# Patient Record
Sex: Male | Born: 1985 | Race: Black or African American | Hispanic: No | Marital: Single
Health system: Southern US, Community
[De-identification: ages and names within clinical notes are randomized; demographics above are authoritative.]

## PROBLEM LIST (undated history)

## (undated) DIAGNOSIS — L0390 Cellulitis, unspecified: Secondary | ICD-10-CM

---

## 2017-10-07 ENCOUNTER — Encounter (HOSPITAL_COMMUNITY): Payer: Self-pay | Admitting: Emergency Medicine

## 2017-10-07 ENCOUNTER — Ambulatory Visit (HOSPITAL_COMMUNITY)
Admission: EM | Admit: 2017-10-07 | Discharge: 2017-10-07 | Disposition: A | Discharge status: Home / Self Care | Payer: Self-pay | Attending: Physician Assistant | Admitting: Physician Assistant

## 2017-10-07 DIAGNOSIS — S0181XA Laceration without foreign body of other part of head, initial encounter: Secondary | ICD-10-CM

## 2017-10-07 MED ORDER — TETANUS-DIPHTH-ACELL PERTUSSIS 5-2.5-18.5 LF-MCG/0.5 IM SUSP: 0.5000 mL | Freq: Once | INTRAMUSCULAR | Status: DC

## 2017-10-07 NOTE — ED Provider Notes (Signed)
10/07/2017 5:45 PM   DOB: [birth year] / MRN: [id number]  SUBJECTIVE:  Douglas Mata is a 32 y.o. male presenting for laceration to the right lateral supraorbital arch.  Patient tells me he was standing up today and hit his head on the cabinet.  He came straight here after.  Had a tetanus shot roughly a year ago.  Denies headache, vision changes, eye pain.    He has No Known Allergies.   He  has no past medical history on file.    He   He  has no sexual activity history on file. The patient  has no past surgical history on file.  His family history is not on file.  ROS per HPI  OBJECTIVE:  BP 116/80 (BP Location: Left Arm)   Pulse 68   Temp 97.8 F (36.6 C) (Oral)   Resp 18   SpO2 100%   Physical Exam  Constitutional: He is oriented to person, place, and time. He appears well-developed. He does not appear ill.  HENT:  Head:    Eyes: Pupils are equal, round, and reactive to light. Conjunctivae and EOM are normal.  Cardiovascular: Normal rate, S1 normal, S2 normal and normal pulses.  Pulmonary/Chest: Effort normal. He has no rales.  Abdominal: He exhibits no distension.  Musculoskeletal: Normal range of motion. He exhibits no edema.  Neurological: He is alert and oriented to person, place, and time. No cranial nerve deficit. Coordination normal.  Skin: Skin is warm and dry. He is not diaphoretic.  Psychiatric: He has a normal mood and affect.  Nursing note and vitals reviewed.  Risk and benefits discussed and verbal consent obtained. Anesthetic allergies reviewed. Patient anesthetized using 1:1 mix of 2% lidocaine with epi and Marcaine. The wound was cleansed thoroughly with soap and water. Sterile prep and drape. Wound closed with 3 throws using 6-0 dissolvalble suture material. Hemostasis achieved. Mupirocin applied to the wound and bandage placed. The patient tolerated well. Wound instructions were provided and the patient is to return in 8 days for suture removal, if the  sutures do not fall out first.    No results found for this or any previous visit (from the past 72 hour(s)).  No results found.  ASSESSMENT AND PLAN:  No orders of the defined types were placed in this encounter.    Laceration of forehead, initial encounter: Repaired.       The patient is advised to call or return to clinic if he does not see an improvement in symptoms, or to seek the care of the closest emergency department if he worsens with the above plan.   Deliah Boston, MHS, PA-C 10/07/2017 5:45 PM    Ofilia Neas, PA-C 10/07/17 1747

## 2017-10-07 NOTE — Discharge Instructions (Addendum)
Come back in in about about 8 days if the suture have not fallen out.  After 6 days it is okay to start to gently tug on the sutures yourself if you would like.

## 2017-10-07 NOTE — ED Triage Notes (Signed)
Pt with laceration to right side of head from cabinet

## 2019-06-02 ENCOUNTER — Emergency Department (HOSPITAL_COMMUNITY)
Admission: EM | Admit: 2019-06-02 | Discharge: 2019-06-02 | Disposition: A | Discharge status: Home / Self Care | Payer: Self-pay | Attending: Emergency Medicine | Admitting: Emergency Medicine

## 2019-06-02 ENCOUNTER — Other Ambulatory Visit: Payer: Self-pay

## 2019-06-02 ENCOUNTER — Encounter (HOSPITAL_COMMUNITY): Payer: Self-pay | Admitting: Emergency Medicine

## 2019-06-02 DIAGNOSIS — S0181XA Laceration without foreign body of other part of head, initial encounter: Secondary | ICD-10-CM | POA: Insufficient documentation

## 2019-06-02 DIAGNOSIS — Y999 Unspecified external cause status: Secondary | ICD-10-CM | POA: Insufficient documentation

## 2019-06-02 DIAGNOSIS — Z5321 Procedure and treatment not carried out due to patient leaving prior to being seen by health care provider: Secondary | ICD-10-CM | POA: Insufficient documentation

## 2019-06-02 DIAGNOSIS — Y9289 Other specified places as the place of occurrence of the external cause: Secondary | ICD-10-CM | POA: Insufficient documentation

## 2019-06-02 DIAGNOSIS — Y9389 Activity, other specified: Secondary | ICD-10-CM | POA: Insufficient documentation
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
## 2020-02-28 ENCOUNTER — Other Ambulatory Visit: Payer: Self-pay

## 2020-02-28 ENCOUNTER — Emergency Department (HOSPITAL_COMMUNITY)
Admission: EM | Admit: 2020-02-28 | Discharge: 2020-02-29 | Disposition: A | Discharge status: Home / Self Care | Payer: Self-pay | Attending: Emergency Medicine | Admitting: Emergency Medicine

## 2020-02-28 ENCOUNTER — Emergency Department (HOSPITAL_COMMUNITY): Payer: Self-pay

## 2020-02-28 DIAGNOSIS — Z23 Encounter for immunization: Secondary | ICD-10-CM | POA: Insufficient documentation

## 2020-02-28 DIAGNOSIS — S0993XA Unspecified injury of face, initial encounter: Secondary | ICD-10-CM

## 2020-02-28 DIAGNOSIS — S80211A Abrasion, right knee, initial encounter: Secondary | ICD-10-CM | POA: Insufficient documentation

## 2020-02-28 DIAGNOSIS — S51811A Laceration without foreign body of right forearm, initial encounter: Secondary | ICD-10-CM | POA: Insufficient documentation

## 2020-02-28 DIAGNOSIS — R451 Restlessness and agitation: Secondary | ICD-10-CM | POA: Insufficient documentation

## 2020-02-28 DIAGNOSIS — K08109 Complete loss of teeth, unspecified cause, unspecified class: Secondary | ICD-10-CM | POA: Insufficient documentation

## 2020-02-28 DIAGNOSIS — Y9289 Other specified places as the place of occurrence of the external cause: Secondary | ICD-10-CM | POA: Insufficient documentation

## 2020-02-28 DIAGNOSIS — W228XXA Striking against or struck by other objects, initial encounter: Secondary | ICD-10-CM | POA: Insufficient documentation

## 2020-02-28 DIAGNOSIS — Z20822 Contact with and (suspected) exposure to covid-19: Secondary | ICD-10-CM | POA: Insufficient documentation

## 2020-02-28 DIAGNOSIS — Y9389 Activity, other specified: Secondary | ICD-10-CM | POA: Insufficient documentation

## 2020-02-28 DIAGNOSIS — F10920 Alcohol use, unspecified with intoxication, uncomplicated: Secondary | ICD-10-CM

## 2020-02-28 LAB — RESPIRATORY PANEL BY RT PCR (FLU A&B, COVID)
Influenza A by PCR: NEGATIVE
Influenza B by PCR: NEGATIVE
SARS Coronavirus 2 by RT PCR: NEGATIVE

## 2020-02-28 LAB — CBC WITH DIFFERENTIAL/PLATELET
Abs Immature Granulocytes: 0.03 K/uL (ref 0.00–0.07)
Basophils Absolute: 0 K/uL (ref 0.0–0.1)
Basophils Relative: 0 %
Eosinophils Absolute: 0 K/uL (ref 0.0–0.5)
Eosinophils Relative: 1 %
HCT: 35.2 % — ABNORMAL LOW (ref 39.0–52.0)
Hemoglobin: 12.5 g/dL — ABNORMAL LOW (ref 13.0–17.0)
Immature Granulocytes: 0 %
Lymphocytes Relative: 32 %
Lymphs Abs: 2.6 K/uL (ref 0.7–4.0)
MCH: 32.3 pg (ref 26.0–34.0)
MCHC: 35.5 g/dL (ref 30.0–36.0)
MCV: 91 fL (ref 80.0–100.0)
Monocytes Absolute: 0.8 K/uL (ref 0.1–1.0)
Monocytes Relative: 9 %
Neutro Abs: 4.7 K/uL (ref 1.7–7.7)
Neutrophils Relative %: 58 %
Platelets: 218 K/uL (ref 150–400)
RBC: 3.87 MIL/uL — ABNORMAL LOW (ref 4.22–5.81)
RDW: 13.1 % (ref 11.5–15.5)
WBC: 8.1 K/uL (ref 4.0–10.5)
nRBC: 0 % (ref 0.0–0.2)

## 2020-02-28 LAB — BASIC METABOLIC PANEL
Anion gap: 14 (ref 5–15)
BUN: 13 mg/dL (ref 6–20)
CO2: 23 mmol/L (ref 22–32)
Calcium: 9.1 mg/dL (ref 8.9–10.3)
Chloride: 104 mmol/L (ref 98–111)
Creatinine, Ser: 1.33 mg/dL — ABNORMAL HIGH (ref 0.61–1.24)
GFR calc Af Amer: >60 mL/min (ref >60)
GFR calc non Af Amer: >60 mL/min (ref >60)
Glucose, Bld: 83 mg/dL (ref 70–99)
Potassium: 3.4 mmol/L — ABNORMAL LOW (ref 3.5–5.1)
Sodium: 141 mmol/L (ref 135–145)

## 2020-02-28 LAB — ETHANOL: Alcohol, Ethyl (B): 254 mg/dL — ABNORMAL HIGH (ref <10)

## 2020-02-28 MED ORDER — SODIUM CHLORIDE 0.9 % IV BOLUS
1000.0000 mL | Freq: Once | INTRAVENOUS | Status: AC
  Administered 2020-02-28: 1000 mL via INTRAVENOUS

## 2020-02-28 MED ORDER — SODIUM CHLORIDE 0.9 % IV BOLUS
1000.0000 mL | Freq: Once | INTRAVENOUS | Status: AC
Start: 2020-02-28 — End: 2020-02-29
  Administered 2020-02-28: 1000 mL via INTRAVENOUS

## 2020-02-28 MED ORDER — CEFAZOLIN SODIUM-DEXTROSE 1-4 GM/50ML-% IV SOLN
1.0000 g | Freq: Once | INTRAVENOUS | Status: AC
Start: 2020-02-28 — End: 2020-02-28
  Administered 2020-02-28: 1 g via INTRAVENOUS
  Filled 2020-02-28: qty 50

## 2020-02-28 MED ORDER — FENTANYL CITRATE (PF) 100 MCG/2ML IJ SOLN
50.0000 ug | Freq: Once | INTRAMUSCULAR | Status: AC
Start: 2020-02-28 — End: 2020-02-28
  Administered 2020-02-28: 50 ug via INTRAVENOUS
  Filled 2020-02-28: qty 2

## 2020-02-28 MED ORDER — TETANUS-DIPHTH-ACELL PERTUSSIS 5-2.5-18.5 LF-MCG/0.5 IM SUSP
0.5000 mL | Freq: Once | INTRAMUSCULAR | Status: AC
  Administered 2020-02-28: 0.5 mL via INTRAMUSCULAR
  Filled 2020-02-28: qty 0.5

## 2020-02-28 MED ORDER — LIDOCAINE-EPINEPHRINE 2 %-1:200000 IJ SOLN
20.0000 mL | Freq: Once | INTRAMUSCULAR | Status: AC
Start: 2020-02-28 — End: 2020-02-28
  Administered 2020-02-28: 20 mL
  Filled 2020-02-28: qty 20
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
## 2020-02-29 ENCOUNTER — Other Ambulatory Visit: Payer: Self-pay

## 2020-02-29 LAB — TYPE AND SCREEN
ABO/RH(D): A POS
Antibody Screen: NEGATIVE

## 2020-02-29 LAB — RAPID URINE DRUG SCREEN, HOSP PERFORMED
Amphetamines: POSITIVE — AB
Barbiturates: NOT DETECTED
Benzodiazepines: NOT DETECTED
Cocaine: POSITIVE — AB
Opiates: NOT DETECTED
Tetrahydrocannabinol: POSITIVE — AB

## 2020-02-29 MED ORDER — CEPHALEXIN 500 MG PO CAPS: 500.0000 mg | ORAL_CAPSULE | Freq: Three times a day (TID) | ORAL | 0 refills | Status: AC

## 2020-02-29 NOTE — ED Notes (Signed)
Patient discharged A&Ox4. Right arm/hand is warm to touch and peripheral pulse are +3 in both wrist. Patient is ambulatory and has no concerns at this time.

## 2020-02-29 NOTE — Discharge Instructions (Addendum)
Follow-up with the dentist listed below for evaluation of your missing tooth.  Wash the lacerations twice a day with soapy water.  Otherwise, keep covered and clean. Take Tylenol or ibuprofen as needed for pain. Take antibiotics as prescribed to help prevent infection. If you have bleeding, hold firm, constant pressure for at least 30 minutes.  Bleeding continues, return to the ER.  You will need to return to the ER in 10 days for suture removal.  Return to the emergency room if you develop fevers, pus draining from the cuts, severe worsening pain, any new, worsening, concerning symptoms.

## 2020-03-02 ENCOUNTER — Encounter (HOSPITAL_COMMUNITY): Payer: Self-pay

## 2020-03-02 ENCOUNTER — Other Ambulatory Visit: Payer: Self-pay

## 2020-03-02 ENCOUNTER — Emergency Department (HOSPITAL_COMMUNITY)
Admission: EM | Admit: 2020-03-02 | Discharge: 2020-03-02 | Disposition: A | Discharge status: Home / Self Care | Payer: Medicaid Other | Attending: Emergency Medicine | Admitting: Emergency Medicine

## 2020-03-02 DIAGNOSIS — S51811D Laceration without foreign body of right forearm, subsequent encounter: Secondary | ICD-10-CM | POA: Insufficient documentation

## 2020-03-02 DIAGNOSIS — Z5189 Encounter for other specified aftercare: Secondary | ICD-10-CM

## 2020-03-02 DIAGNOSIS — W25XXXD Contact with sharp glass, subsequent encounter: Secondary | ICD-10-CM | POA: Insufficient documentation

## 2020-03-02 DIAGNOSIS — D500 Iron deficiency anemia secondary to blood loss (chronic): Secondary | ICD-10-CM | POA: Insufficient documentation

## 2020-03-02 DIAGNOSIS — Z4800 Encounter for change or removal of nonsurgical wound dressing: Secondary | ICD-10-CM | POA: Insufficient documentation

## 2020-03-02 DIAGNOSIS — F17210 Nicotine dependence, cigarettes, uncomplicated: Secondary | ICD-10-CM | POA: Insufficient documentation

## 2020-03-02 LAB — CBC
HCT: 28.1 % — ABNORMAL LOW (ref 39.0–52.0)
Hemoglobin: 9.8 g/dL — ABNORMAL LOW (ref 13.0–17.0)
MCH: 32.1 pg (ref 26.0–34.0)
MCHC: 34.9 g/dL (ref 30.0–36.0)
MCV: 92.1 fL (ref 80.0–100.0)
Platelets: 204 K/uL (ref 150–400)
RBC: 3.05 MIL/uL — ABNORMAL LOW (ref 4.22–5.81)
RDW: 12.8 % (ref 11.5–15.5)
WBC: 6 K/uL (ref 4.0–10.5)
nRBC: 0 % (ref 0.0–0.2)

## 2020-03-02 NOTE — Discharge Instructions (Addendum)
Take over-the-counter iron supplements for the next 2 weeks.  Your blood count should return to normal.  Continue to change the dressing daily.  Apply antibiotic ointment to the wound daily.  Follow-up as previously recommended for your suture removal

## 2020-03-02 NOTE — ED Provider Notes (Signed)
[facility]-EMERGENCY DEPT Provider Note   CSN: [id number] Arrival date & time: 03/02/20  1726     History Chief Complaint  Patient presents with  . Dizziness  . Wound Check    Douglas Mata is a 34 y.o. male.  HPI   Patient was seen in the emergency room on October 3.  Patient had accidentally put his hand through a window.  Patient ended up sustaining lacerations to his forearm.  Patient required extensive wound care to control the bleeding.  Patient states he still has noticed some oozing of blood through the wound.  Specifically whenever he takes the dressing off it will start to bleed.  Patient also started to feel somewhat lightheaded today.  He denies any fevers or chills.  He has some slight nausea.  No syncopal episodes.  No chest pain or shortness of breath.  History reviewed. No pertinent past medical history.  There are no problems to display for this patient.   History reviewed. No pertinent surgical history.     Family History  Problem Relation Age of Onset  . Multiple sclerosis Mother     Social History   Tobacco Use  . Smoking status: Current Every Day Smoker    Packs/day: 0.50    Types: Cigarettes  . Smokeless tobacco: Never Used  Vaping Use  . Vaping Use: Some days  Substance Use Topics  . Alcohol use: Yes  . Drug use: Never    Home Medications Prior to Admission medications   Medication Sig Start Date End Date Taking? Authorizing Provider  cephALEXin (KEFLEX) 500 MG capsule Take 1 capsule (500 mg total) by mouth 3 (three) times daily for 7 days. 02/29/20 03/07/20  Caccavale, Sophia, PA-C    Allergies    Patient has no known allergies.  Review of Systems   Review of Systems  All other systems reviewed and are negative.   Physical Exam Updated Vital Signs BP 127/79 (BP Location: Left Arm)   Pulse 79   Temp 98.5 F (36.9 C) (Oral)   Resp 16   Ht 1.651 m (5\' 5" )   Wt 52.2 kg   SpO2 100%   BMI 19.14 kg/m     Physical Exam Vitals and nursing note reviewed.  Constitutional:      General: He is not in acute distress.    Appearance: He is well-developed.  HENT:     Head: Normocephalic and atraumatic.     Right Ear: External ear normal.     Left Ear: External ear normal.  Eyes:     General: No scleral icterus.       Right eye: No discharge.        Left eye: No discharge.     Conjunctiva/sclera: Conjunctivae normal.  Neck:     Trachea: No tracheal deviation.  Cardiovascular:     Rate and Rhythm: Normal rate and regular rhythm.     Pulses: Normal pulses.     Heart sounds: Normal heart sounds.  Pulmonary:     Effort: Pulmonary effort is normal. No respiratory distress.     Breath sounds: No stridor.  Abdominal:     General: There is no distension.  Musculoskeletal:        General: No swelling or deformity.     Cervical back: Neck supple.     Comments: Dressing removed from sutured wounds on the right forearm, no pulsatile bleeding, slight oozing when the scab was pulled off as the dressing was dry,  good granulation tissue noted  Skin:    General: Skin is warm and dry.     Findings: No rash.  Neurological:     Mental Status: He is alert.     Cranial Nerves: Cranial nerve deficit: no gross deficits.     ED Results / Procedures / Treatments   Labs (all labs ordered are listed, but only abnormal results are displayed) Labs Reviewed  CBC - Abnormal; Notable for the following components:      Result Value   RBC 3.05 (*)    Hemoglobin 9.8 (*)    HCT 28.1 (*)    All other components within normal limits    EKG None  Radiology No results found.  Procedures Procedures (including critical care time) Vaseline gauze applied to the wounds, sterile gauze and applied over the Vaseline gauze and arm loosely wrapped with Coban dressing.  No active bleeding after dressing was applied Medications Ordered in ED Medications - No data to display  ED Course  I have reviewed the triage  vital signs and the nursing notes.  Pertinent labs & imaging results that were available during my care of the patient were reviewed by me and considered in my medical decision making (see chart for details).  Clinical Course as of Mar 02 2005  Tue Mar 02, 2020  1955 Patient's hemoglobin is 9.8.  This is a decrease from 3 days ago when it was 12.5   [JK]    Clinical Course User Index [JK] Linwood Dibbles, MD   MDM Rules/Calculators/A&P                          Patient presented with concerns of bleeding from his wound as well as some lightheadedness.  Patient did have some bleeding from the wound when I removed his dressing but it was when the scab was pulled off from the granulation tissue.  Dressing was dry and I recommended antibiotic ointment to help prevent pulling the scab off.  Patient was redressed with antibiotic gauze ointment.  Patient does have anemia related to his recent blood loss from his extremity wound.  Fortunately no indication for transfusion and he is hemodynamically stable without any significant active bleeding at this time.  I recommended taking over-the-counter iron. Final Clinical Impression(s) / ED Diagnoses Final diagnoses:  Blood loss anemia  Visit for wound check    Rx / DC Orders ED Discharge Orders    None       Linwood Dibbles, MD 03/02/20 2007

## 2020-03-02 NOTE — ED Triage Notes (Signed)
patient c/o feeling lightheaded with slight nausea since this AM.  Patient states his right forearm wound is still bleeding. Patient states he cut his arm on glass 3 days ago. Patient states he has numbness of the right inner wrist that started this AM.

## 2020-04-04 ENCOUNTER — Encounter (HOSPITAL_COMMUNITY): Payer: Self-pay

## 2020-04-04 ENCOUNTER — Other Ambulatory Visit: Payer: Self-pay

## 2020-04-04 ENCOUNTER — Emergency Department (HOSPITAL_COMMUNITY)
Admission: EM | Admit: 2020-04-04 | Discharge: 2020-04-04 | Discharge status: Against Medical Advice | Payer: Medicaid Other | Attending: Emergency Medicine | Admitting: Emergency Medicine

## 2020-04-04 DIAGNOSIS — T8189XA Other complications of procedures, not elsewhere classified, initial encounter: Secondary | ICD-10-CM

## 2020-04-04 DIAGNOSIS — X58XXXA Exposure to other specified factors, initial encounter: Secondary | ICD-10-CM | POA: Insufficient documentation

## 2020-04-04 DIAGNOSIS — T85692A Other mechanical complication of permanent sutures, initial encounter: Secondary | ICD-10-CM | POA: Insufficient documentation

## 2020-04-04 DIAGNOSIS — Z7982 Long term (current) use of aspirin: Secondary | ICD-10-CM | POA: Insufficient documentation

## 2020-04-04 DIAGNOSIS — T7840XA Allergy, unspecified, initial encounter: Secondary | ICD-10-CM

## 2020-04-04 DIAGNOSIS — S51811D Laceration without foreign body of right forearm, subsequent encounter: Secondary | ICD-10-CM | POA: Insufficient documentation

## 2020-04-04 DIAGNOSIS — F17210 Nicotine dependence, cigarettes, uncomplicated: Secondary | ICD-10-CM | POA: Insufficient documentation

## 2020-04-04 MED ORDER — LIDOCAINE-EPINEPHRINE 2 %-1:100000 IJ SOLN
20.0000 mL | Freq: Once | INTRAMUSCULAR | Status: AC
  Administered 2020-04-04: 20 mL
  Filled 2020-04-04: qty 1

## 2020-04-04 MED ORDER — FAMOTIDINE 20 MG PO TABS
20.0000 mg | ORAL_TABLET | Freq: Once | ORAL | Status: AC
  Administered 2020-04-04: 20 mg via ORAL
  Filled 2020-04-04: qty 1

## 2020-04-04 MED ORDER — PREDNISONE 20 MG PO TABS
60.0000 mg | ORAL_TABLET | Freq: Once | ORAL | Status: AC
Start: 2020-04-04 — End: 2020-04-04
  Administered 2020-04-04: 60 mg via ORAL
  Filled 2020-04-04: qty 3

## 2020-04-04 MED ORDER — DIPHENHYDRAMINE HCL 25 MG PO CAPS
25.0000 mg | ORAL_CAPSULE | Freq: Once | ORAL | Status: AC
  Administered 2020-04-04: 25 mg via ORAL
  Filled 2020-04-04: qty 1

## 2020-04-04 NOTE — ED Notes (Signed)
RN attempted to call patient's listed phone number as well as emergency contact number to determine if he had left with no success. Patient not visualized in the room and his belongings not in room. Assumed to have left at this time. Patient last seen in no active distress. PA made aware.

## 2020-04-04 NOTE — ED Notes (Signed)
Pt provided 2 warm blankets for comfort.

## 2020-04-04 NOTE — ED Provider Notes (Signed)
[facility]-EMERGENCY DEPT Provider Note   CSN: [id number] Arrival date & time: 04/04/20  0998     History Chief Complaint  Patient presents with  . Allergic Reaction    Douglas Mata is a 34 y.o. male.  Douglas Mata is a 34 y.o. male who is otherwise healthy, presents to the emergency department for evaluation of rash.  He reports itchy erythematous rash that is present over his trunk and extremities.  He states that the rash first started yesterday around his torso, and then he noted it on his arms and the legs.  And after wiping his face with a towel he noted a similar rash on his face.  He thinks this rash is likely from a new detergent that he bought on sale, he has never used this before and reaction started after he first used house that he had wash and then put on clothes that he had washed in the same detergent.  After taking a shower and watching this away it seemed to help some but he continued to have itching and rash.  Applied some Benadryl cream to the rash on his torso with some improvement.  No facial swelling, no mucosal involvement.  No difficulty breathing.  Patient also reports that he has sutures present in his right forearm that he never had removed after having a laceration closed on 10/2.        History reviewed. No pertinent past medical history.  There are no problems to display for this patient.   History reviewed. No pertinent surgical history.     Family History  Problem Relation Age of Onset  . Multiple sclerosis Mother     Social History   Tobacco Use  . Smoking status: Current Every Day Smoker    Packs/day: 0.50    Types: Cigarettes  . Smokeless tobacco: Never Used  Vaping Use  . Vaping Use: Some days  Substance Use Topics  . Alcohol use: Yes  . Drug use: Never    Home Medications Prior to Admission medications   Medication Sig Start Date End Date Taking? Authorizing Provider    aspirin-acetaminophen-caffeine (EXCEDRIN MIGRAINE) [phone] MG tablet Take 2 tablets by mouth every 6 (six) hours as needed for headache.   Yes [provider]  diphenhydrAMINE (BENADRYL) 2 % cream Apply 1 application topically 3 (three) times daily as needed for itching (back rash).   Yes [provider]  polyvinyl alcohol (LIQUIFILM TEARS) 1.4 % ophthalmic solution Place 1 drop into both eyes as needed for dry eyes.   Yes [provider]    Allergies    Patient has no known allergies.  Review of Systems   Review of Systems  Constitutional: Negative for chills and fever.  HENT: Negative for facial swelling and trouble swallowing.   Respiratory: Negative for shortness of breath.   Skin: Positive for rash and wound.  All other systems reviewed and are negative.   Physical Exam Updated Vital Signs BP 115/76   Pulse 62   Temp 98.4 F (36.9 C) (Oral)   Resp 16   SpO2 100%   Physical Exam Vitals and nursing note reviewed.  Constitutional:      General: He is not in acute distress.    Appearance: Normal appearance. He is well-developed. He is not ill-appearing or diaphoretic.  HENT:     Head: Normocephalic and atraumatic.     Mouth/Throat:     Mouth: Mucous membranes are moist.  Pharynx: Oropharynx is clear.     Comments: No mucosal involvement of rash Eyes:     General:        Right eye: No discharge.        Left eye: No discharge.  Pulmonary:     Effort: Pulmonary effort is normal. No respiratory distress.  Musculoskeletal:     Comments: Right forearm with two healed wounds with sutures buried, with overlying keloids developing, the tips of a few buried suture knots are visible  Skin:    General: Skin is warm and dry.     Findings: Rash present.     Comments: Erythematous papular rash over the chest, back and extremities, no petechiae, pustules or skin sloughing  Neurological:     Mental Status: He is alert.     Coordination:  Coordination normal.  Psychiatric:        Behavior: Behavior normal.     ED Results / Procedures / Treatments   Labs (all labs ordered are listed, but only abnormal results are displayed) Labs Reviewed - No data to display  EKG None  Radiology No results found.  Procedures Procedures (including critical care time)  Medications Ordered in ED Medications - No data to display  ED Course  I have reviewed the triage vital signs and the nursing notes.  Pertinent labs & imaging results that were available during my care of the patient were reviewed by me and considered in my medical decision making (see chart for details).    MDM Rules/Calculators/A&P                         Rash consistent with contact dermatitis from new detergent. Patient denies any difficulty breathing or swallowing.  Pt has a patent airway without stridor and is handling secretions without difficulty; no angioedema. No blisters, no pustules, no warmth, no draining sinus tracts, no superficial abscesses, no bullous impetigo, no vesicles, no desquamation, no target lesions with dusky purpura or a central bulla. Not tender to touch. No concern for superimposed infection. No concern for SJS, TEN, TSS, tick borne illness, syphilis or other life-threatening condition.  Discussed getting rid of new detergent and making sure to wash any clothes or items that were washed in it and new detergent to remove contact irritant.  Rash treated here in the emergency department with Benadryl, Pepcid and prednisone with improvement.  Patient had 6 sutures placed over 2 separate wounds on the right forearm on 10/2 but never came in to have sutures removed, now the skin has started to grow and heal over the sutures.  There are still a few suture knots visible, I was able to remove 2 of the sutures, but the others are buried beneath the skin and difficult to remove.  Will use lidocaine to numb the area and attempt to remove the  sutures.  Went back to complete suture removal and patient had eloped from the department, nursing staff tried calling the phone number on file, no answer and they were unable to locate the patient.  He left before receiving prescriptions for additional Pepcid, Benadryl and prednisone for his allergic reaction as well.   Final Clinical Impression(s) / ED Diagnoses Final diagnoses:  Allergic reaction, initial encounter  Retained suture, initial encounter    Rx / DC Orders ED Discharge Orders    None       Legrand Rams 04/04/20 2257    Pollyann Savoy, MD 04/05/20 [phone]

## 2020-04-04 NOTE — ED Triage Notes (Signed)
Pt presents with c/o rash all over his body. Pt reports they have switched to a new detergent and is unsure if this has caused an allergic reaction. Pt also has a wound on his arm that he never had the sutures removed from.

## 2020-04-08 ENCOUNTER — Emergency Department (HOSPITAL_COMMUNITY)
Admission: EM | Admit: 2020-04-08 | Discharge: 2020-04-08 | Disposition: A | Discharge status: Home / Self Care | Payer: Medicaid Other | Attending: Emergency Medicine | Admitting: Emergency Medicine

## 2020-04-08 ENCOUNTER — Encounter (HOSPITAL_COMMUNITY): Payer: Self-pay

## 2020-04-08 ENCOUNTER — Other Ambulatory Visit: Payer: Self-pay

## 2020-04-08 DIAGNOSIS — L309 Dermatitis, unspecified: Secondary | ICD-10-CM

## 2020-04-08 DIAGNOSIS — F17210 Nicotine dependence, cigarettes, uncomplicated: Secondary | ICD-10-CM | POA: Insufficient documentation

## 2020-04-08 DIAGNOSIS — L240 Irritant contact dermatitis due to detergents: Secondary | ICD-10-CM | POA: Insufficient documentation

## 2020-04-08 MED ORDER — HYDROXYZINE HCL 25 MG PO TABS: 25.0000 mg | ORAL_TABLET | Freq: Four times a day (QID) | ORAL | 0 refills | Status: DC | PRN

## 2020-04-08 MED ORDER — PREDNISONE 10 MG PO TABS: ORAL_TABLET | ORAL | 0 refills | Status: DC
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
## 2020-04-08 NOTE — ED Provider Notes (Signed)
[facility] EMERGENCY DEPARTMENT Provider Note   CSN: [id number] Arrival date & time: 04/08/20  1731     History Chief Complaint  Patient presents with  . Rash    Douglas Mata is a 34 y.o. male.  HPI       He reports itchy erythematous rash that is present over his trunk and extremities.  Rash started a week ago, worse his torso/abdomen/back and also present on his arms and the legs.  Has hx of sensitive skin.  Started using a new detergent that he bought on sale, he has never used this before and reaction started after he first used house that he had wash and then put on clothes that he had washed in the same detergent.  He has not rewashed his clothes with different detergent.   Applied some Benadryl cream to the rash on his torso with some improvement.  No facial swelling, no mucosal involvement.  No difficulty breathing. No fevers.  No contacts with similar rash. No other new exposures.  Patient also reports that he has sutures present in his right forearm that he never had removed after having a laceration closed on 10/2. Had some removed during recent ED visit but some were buried. He eloped prior to attempted removal.    History reviewed. No pertinent past medical history.  There are no problems to display for this patient.   History reviewed. No pertinent surgical history.     Family History  Problem Relation Age of Onset  . Multiple sclerosis Mother     Social History   Tobacco Use  . Smoking status: Current Every Day Smoker    Packs/day: 0.50    Types: Cigarettes  . Smokeless tobacco: Never Used  Vaping Use  . Vaping Use: Some days  Substance Use Topics  . Alcohol use: Yes  . Drug use: Never    Home Medications Prior to Admission medications   Medication Sig Start Date End Date Taking? Authorizing Provider  aspirin-acetaminophen-caffeine (EXCEDRIN MIGRAINE) [phone] MG tablet Take 2 tablets by mouth every 6 (six) hours as  needed for headache.    [provider]  diphenhydrAMINE (BENADRYL) 2 % cream Apply 1 application topically 3 (three) times daily as needed for itching (back rash).    [provider]  hydrOXYzine (ATARAX/VISTARIL) 25 MG tablet Take 1 tablet (25 mg total) by mouth every 6 (six) hours as needed for itching. 04/08/20   Alvira Monday, MD  polyvinyl alcohol (LIQUIFILM TEARS) 1.4 % ophthalmic solution Place 1 drop into both eyes as needed for dry eyes.    [provider]  predniSONE (DELTASONE) 10 MG tablet Take 40mg  (4 tablets) for 4 days, 30mg  (3 tablets) for 3 days, 2 tablets for 2 days then one tablet for 2 days. 04/08/20   , MD    Allergies    Patient has no known allergies.  Review of Systems   Review of Systems  Constitutional: Negative for fever.  HENT: Negative for sore throat.   Eyes: Negative for visual disturbance.  Respiratory: Negative for shortness of breath.   Cardiovascular: Negative for chest pain.  Gastrointestinal: Negative for abdominal pain, nausea and vomiting.  Genitourinary: Negative for difficulty urinating.  Skin: Positive for rash.  Neurological: Negative for syncope.    Physical Exam Updated Vital Signs BP 102/66   Pulse 81   Temp 98 F (36.7 C) (Oral)   Resp 14   SpO2 99%   Physical Exam Vitals and  nursing note reviewed.  Constitutional:      General: He is not in acute distress.    Appearance: Normal appearance. He is not ill-appearing, toxic-appearing or diaphoretic.  HENT:     Head: Normocephalic.  Eyes:     Conjunctiva/sclera: Conjunctivae normal.  Cardiovascular:     Rate and Rhythm: Normal rate and regular rhythm.     Pulses: Normal pulses.  Pulmonary:     Effort: Pulmonary effort is normal. No respiratory distress.  Musculoskeletal:        General: No deformity or signs of injury.     Cervical back: No rigidity.  Skin:    General: Skin is warm and dry.     Coloration: Skin is not jaundiced  or pale.     Findings: Rash (small erythematous papules scattered over body including torso, neck, head, arms, legs. No papules in web space ) present.     Comments: Healed lacerations right forearm, tip of retrained suture felt by one, no surrounding erythema, no fluctuance   Neurological:     General: No focal deficit present.     Mental Status: He is alert and oriented to person, place, and time.     ED Results / Procedures / Treatments   Labs (all labs ordered are listed, but only abnormal results are displayed) Labs Reviewed - No data to display  EKG None  Radiology No results found.  Procedures Procedures (including critical care time)  Medications Ordered in ED Medications - No data to display  ED Course  I have reviewed the triage vital signs and the nursing notes.  Pertinent labs & imaging results that were available during my care of the patient were reviewed by me and considered in my medical decision making (see chart for details).    MDM Rules/Calculators/A&P                          34yo male presents with concern for pruritic rash for 1 week in setting of starting new detergent.  Rash does not have the appearance of SSS, TEN, erythroderma, scabies, RMSF or hives.  Suspect allergic dermatitis to new detergent. Will give rx for hydroxyzine and steroids. Also note retained sutures--offered removal today using lidocaine to extract however no sign of infection inflammation and do not feel emergent removal necessary and patient prefers outpatient follow up. Will be given number for plastic surgery for outpatient retained suture removal.    Final Clinical Impression(s) / ED Diagnoses Final diagnoses:  Dermatitis    Rx / DC Orders ED Discharge Orders         Ordered    predniSONE (DELTASONE) 10 MG tablet        04/08/20 2140    hydrOXYzine (ATARAX/VISTARIL) 25 MG tablet  Every 6 hours PRN        04/08/20 2140           Alvira Monday, MD 04/10/20  1041

## 2020-04-08 NOTE — ED Notes (Signed)
E-signature pad unavailable at time of pt discharge. This RN discussed discharge materials with pt and answered all pt questions. Pt stated understanding of discharge material. ? ?

## 2020-05-19 ENCOUNTER — Emergency Department (HOSPITAL_COMMUNITY): Payer: Self-pay

## 2020-05-19 ENCOUNTER — Encounter (HOSPITAL_COMMUNITY): Payer: Self-pay | Admitting: Emergency Medicine

## 2020-05-19 ENCOUNTER — Emergency Department (HOSPITAL_COMMUNITY)
Admission: EM | Admit: 2020-05-19 | Discharge: 2020-05-19 | Disposition: A | Discharge status: Home / Self Care | Payer: Self-pay | Attending: Emergency Medicine | Admitting: Emergency Medicine

## 2020-05-19 DIAGNOSIS — S0003XA Contusion of scalp, initial encounter: Secondary | ICD-10-CM | POA: Insufficient documentation

## 2020-05-19 DIAGNOSIS — S50812A Abrasion of left forearm, initial encounter: Secondary | ICD-10-CM | POA: Insufficient documentation

## 2020-05-19 DIAGNOSIS — S50811A Abrasion of right forearm, initial encounter: Secondary | ICD-10-CM | POA: Insufficient documentation

## 2020-05-19 DIAGNOSIS — S0990XA Unspecified injury of head, initial encounter: Secondary | ICD-10-CM

## 2020-05-19 DIAGNOSIS — Z20822 Contact with and (suspected) exposure to covid-19: Secondary | ICD-10-CM | POA: Insufficient documentation

## 2020-05-19 DIAGNOSIS — T148XXA Other injury of unspecified body region, initial encounter: Secondary | ICD-10-CM

## 2020-05-19 DIAGNOSIS — Y92410 Unspecified street and highway as the place of occurrence of the external cause: Secondary | ICD-10-CM | POA: Insufficient documentation

## 2020-05-19 HISTORY — DX: Cellulitis, unspecified: L03.90

## 2020-05-19 LAB — CBC WITH DIFFERENTIAL/PLATELET
Abs Immature Granulocytes: 0.01 10*3/uL (ref 0.00–0.07)
Basophils Absolute: 0 10*3/uL (ref 0.0–0.1)
Basophils Relative: 0 %
Eosinophils Absolute: 0.2 10*3/uL (ref 0.0–0.5)
Eosinophils Relative: 4 %
HCT: 39.8 % (ref 39.0–52.0)
Hemoglobin: 13.7 g/dL (ref 13.0–17.0)
Immature Granulocytes: 0 %
Lymphocytes Relative: 50 %
Lymphs Abs: 2.4 10*3/uL (ref 0.7–4.0)
MCH: 32.1 pg (ref 26.0–34.0)
MCHC: 34.4 g/dL (ref 30.0–36.0)
MCV: 93.2 fL (ref 80.0–100.0)
Monocytes Absolute: 0.6 10*3/uL (ref 0.1–1.0)
Monocytes Relative: 12 %
Neutro Abs: 1.6 10*3/uL — ABNORMAL LOW (ref 1.7–7.7)
Neutrophils Relative %: 34 %
Platelets: 179 10*3/uL (ref 150–400)
RBC: 4.27 MIL/uL (ref 4.22–5.81)
RDW: 13.3 % (ref 11.5–15.5)
WBC: 4.8 10*3/uL (ref 4.0–10.5)
nRBC: 0 % (ref 0.0–0.2)

## 2020-05-19 LAB — COMPREHENSIVE METABOLIC PANEL
ALT: 17 U/L (ref 0–44)
AST: 28 U/L (ref 15–41)
Albumin: 3.8 g/dL (ref 3.5–5.0)
Alkaline Phosphatase: 51 U/L (ref 38–126)
Anion gap: 12 (ref 5–15)
BUN: 10 mg/dL (ref 6–20)
CO2: 21 mmol/L — ABNORMAL LOW (ref 22–32)
Calcium: 8.8 mg/dL — ABNORMAL LOW (ref 8.9–10.3)
Chloride: 105 mmol/L (ref 98–111)
Creatinine, Ser: 0.99 mg/dL (ref 0.61–1.24)
GFR, Estimated: >60 mL/min (ref >60)
Glucose, Bld: 84 mg/dL (ref 70–99)
Potassium: 3.5 mmol/L (ref 3.5–5.1)
Sodium: 138 mmol/L (ref 135–145)
Total Bilirubin: 0.2 mg/dL — ABNORMAL LOW (ref 0.3–1.2)
Total Protein: 6.4 g/dL — ABNORMAL LOW (ref 6.5–8.1)

## 2020-05-19 LAB — ETHANOL: Alcohol, Ethyl (B): 40 mg/dL — ABNORMAL HIGH (ref <10)

## 2020-05-19 MED ORDER — IOHEXOL 300 MG/ML  SOLN
100.0000 mL | Freq: Once | INTRAMUSCULAR | Status: AC | PRN
  Administered 2020-05-19: 100 mL via INTRAVENOUS

## 2020-05-19 NOTE — ED Notes (Addendum)
Patient Alert and oriented to baseline. Stable and ambulatory to baseline. Patient verbalized understanding of the discharge instructions.  Patient belongings were taken by the patient. Ambulated to lobby with male visitor upon discharge.
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
## 2020-05-19 NOTE — ED Notes (Signed)
Patient transported to CT 
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
## 2020-05-19 NOTE — Discharge Instructions (Signed)
Please return for any problem.  °
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
## 2020-05-19 NOTE — ED Provider Notes (Signed)
Patient seen after prior ED provider.  Patient without evidence of significant traumatic injury on work-up.  Patient is ambulating without difficulty prior to discharge.  He denies any thoughts of suicidality or self-harm. Patient appears to be safe to discharge.  Importance of close follow-up is stressed. Strict return precautions given and understood.     Wynetta Fines, MD 05/19/20 1921

## 2020-05-20 ENCOUNTER — Encounter (HOSPITAL_COMMUNITY): Payer: Self-pay

## 2020-05-20 LAB — RESP PANEL BY RT-PCR (FLU A&B, COVID) ARPGX2
Influenza A by PCR: NEGATIVE
Influenza B by PCR: NEGATIVE
SARS Coronavirus 2 by RT PCR: NEGATIVE

## 2020-05-27 ENCOUNTER — Emergency Department (HOSPITAL_COMMUNITY)
Admission: EM | Admit: 2020-05-27 | Discharge: 2020-05-27 | Disposition: A | Discharge status: Home / Self Care | Payer: Medicaid Other | Attending: Emergency Medicine | Admitting: Emergency Medicine

## 2020-05-27 ENCOUNTER — Other Ambulatory Visit: Payer: Self-pay

## 2020-05-27 DIAGNOSIS — W1789XA Other fall from one level to another, initial encounter: Secondary | ICD-10-CM | POA: Insufficient documentation

## 2020-05-27 DIAGNOSIS — Z5321 Procedure and treatment not carried out due to patient leaving prior to being seen by health care provider: Secondary | ICD-10-CM | POA: Insufficient documentation

## 2020-05-27 DIAGNOSIS — L02811 Cutaneous abscess of head [any part, except face]: Secondary | ICD-10-CM | POA: Insufficient documentation
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
## 2020-05-27 NOTE — ED Notes (Signed)
Called to room pt, no response. 

## 2020-06-16 ENCOUNTER — Ambulatory Visit (HOSPITAL_COMMUNITY)
Admission: EM | Admit: 2020-06-16 | Discharge: 2020-06-16 | Disposition: A | Discharge status: Home / Self Care | Payer: Medicaid Other | Attending: Family Medicine | Admitting: Family Medicine

## 2020-06-16 ENCOUNTER — Other Ambulatory Visit: Payer: Self-pay

## 2020-06-16 NOTE — ED Notes (Signed)
Patient called multiple times with no response 

## 2020-06-16 NOTE — ED Notes (Signed)
Patient was called several times with no response.

## 2021-08-08 ENCOUNTER — Emergency Department (HOSPITAL_COMMUNITY): Payer: Self-pay

## 2021-08-08 ENCOUNTER — Emergency Department (HOSPITAL_COMMUNITY)
Admission: EM | Admit: 2021-08-08 | Discharge: 2021-08-08 | Disposition: A | Discharge status: Home / Self Care | Payer: Self-pay | Attending: Emergency Medicine | Admitting: Emergency Medicine

## 2021-08-08 ENCOUNTER — Encounter (HOSPITAL_COMMUNITY): Payer: Self-pay

## 2021-08-08 ENCOUNTER — Other Ambulatory Visit: Payer: Self-pay

## 2021-08-08 DIAGNOSIS — F17200 Nicotine dependence, unspecified, uncomplicated: Secondary | ICD-10-CM | POA: Insufficient documentation

## 2021-08-08 DIAGNOSIS — S0012XA Contusion of left eyelid and periocular area, initial encounter: Secondary | ICD-10-CM | POA: Insufficient documentation

## 2021-08-08 DIAGNOSIS — R040 Epistaxis: Secondary | ICD-10-CM | POA: Insufficient documentation

## 2021-08-08 DIAGNOSIS — S0993XA Unspecified injury of face, initial encounter: Secondary | ICD-10-CM

## 2021-08-08 DIAGNOSIS — S0990XA Unspecified injury of head, initial encounter: Secondary | ICD-10-CM | POA: Insufficient documentation

## 2021-08-08 MED ORDER — ACETAMINOPHEN 325 MG PO TABS
650.0000 mg | ORAL_TABLET | Freq: Once | ORAL | Status: AC
  Administered 2021-08-08: 650 mg via ORAL
  Filled 2021-08-08: qty 2

## 2021-08-08 MED ORDER — FLUORESCEIN SODIUM 1 MG OP STRP
1.0000 | ORAL_STRIP | Freq: Once | OPHTHALMIC | Status: AC
  Administered 2021-08-08: 1 via OPHTHALMIC
  Filled 2021-08-08: qty 1
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
## 2021-08-08 NOTE — ED Notes (Signed)
20/20 vision in right eye. 20/30 vision in left eye. ?
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
## 2022-07-10 ENCOUNTER — Ambulatory Visit (HOSPITAL_COMMUNITY)
Admission: EM | Admit: 2022-07-10 | Discharge: 2022-07-10 | Discharge status: Against Medical Advice | Payer: Medicaid Other

## 2022-07-10 NOTE — BH Assessment (Signed)
@  1455, Clinician called patient's name in the lobby area 2-3 times to complete his triage/screening, no answer. Per registration staff Kennyth Lose), "He did not want to stay, he left".

## 2022-07-12 ENCOUNTER — Other Ambulatory Visit (HOSPITAL_COMMUNITY)
Admission: EM | Admit: 2022-07-12 | Discharge: 2022-07-14 | Disposition: A | Discharge status: Home / Self Care | Payer: Medicaid Other | Attending: Psychiatry | Admitting: Psychiatry

## 2022-07-12 ENCOUNTER — Encounter (HOSPITAL_COMMUNITY): Payer: Self-pay

## 2022-07-12 DIAGNOSIS — F1021 Alcohol dependence, in remission: Secondary | ICD-10-CM | POA: Diagnosis not present

## 2022-07-12 DIAGNOSIS — Z1152 Encounter for screening for COVID-19: Secondary | ICD-10-CM | POA: Insufficient documentation

## 2022-07-12 DIAGNOSIS — Z765 Malingerer [conscious simulation]: Secondary | ICD-10-CM | POA: Diagnosis not present

## 2022-07-12 DIAGNOSIS — F69 Unspecified disorder of adult personality and behavior: Secondary | ICD-10-CM

## 2022-07-12 DIAGNOSIS — F1410 Cocaine abuse, uncomplicated: Secondary | ICD-10-CM

## 2022-07-12 DIAGNOSIS — F1094 Alcohol use, unspecified with alcohol-induced mood disorder: Secondary | ICD-10-CM

## 2022-07-12 DIAGNOSIS — F1010 Alcohol abuse, uncomplicated: Secondary | ICD-10-CM

## 2022-07-12 LAB — COMPREHENSIVE METABOLIC PANEL
ALT: 30 U/L (ref 0–44)
AST: 37 U/L (ref 15–41)
Albumin: 4.1 g/dL (ref 3.5–5.0)
Alkaline Phosphatase: 49 U/L (ref 38–126)
Anion gap: 13 (ref 5–15)
BUN: 8 mg/dL (ref 6–20)
CO2: 27 mmol/L (ref 22–32)
Calcium: 9.6 mg/dL (ref 8.9–10.3)
Chloride: 101 mmol/L (ref 98–111)
Creatinine, Ser: 0.87 mg/dL (ref 0.61–1.24)
GFR, Estimated: >60 mL/min (ref >60)
Glucose, Bld: 72 mg/dL (ref 70–99)
Potassium: 3.9 mmol/L (ref 3.5–5.1)
Sodium: 141 mmol/L (ref 135–145)
Total Bilirubin: 0.4 mg/dL (ref 0.3–1.2)
Total Protein: 6.7 g/dL (ref 6.5–8.1)

## 2022-07-12 LAB — RESP PANEL BY RT-PCR (RSV, FLU A&B, COVID)  RVPGX2
Influenza A by PCR: NEGATIVE
Influenza B by PCR: NEGATIVE
Resp Syncytial Virus by PCR: NEGATIVE
SARS Coronavirus 2 by RT PCR: NEGATIVE

## 2022-07-12 LAB — CBC WITH DIFFERENTIAL/PLATELET
Abs Immature Granulocytes: 0.01 10*3/uL (ref 0.00–0.07)
Basophils Absolute: 0 10*3/uL (ref 0.0–0.1)
Basophils Relative: 0 %
Eosinophils Absolute: 0 10*3/uL (ref 0.0–0.5)
Eosinophils Relative: 1 %
HCT: 39.3 % (ref 39.0–52.0)
Hemoglobin: 14 g/dL (ref 13.0–17.0)
Immature Granulocytes: 0 %
Lymphocytes Relative: 45 %
Lymphs Abs: 1.8 10*3/uL (ref 0.7–4.0)
MCH: 32.8 pg (ref 26.0–34.0)
MCHC: 35.6 g/dL (ref 30.0–36.0)
MCV: 92 fL (ref 80.0–100.0)
Monocytes Absolute: 0.4 10*3/uL (ref 0.1–1.0)
Monocytes Relative: 10 %
Neutro Abs: 1.8 10*3/uL (ref 1.7–7.7)
Neutrophils Relative %: 44 %
Platelets: 232 10*3/uL (ref 150–400)
RBC: 4.27 MIL/uL (ref 4.22–5.81)
RDW: 13 % (ref 11.5–15.5)
WBC: 4 10*3/uL (ref 4.0–10.5)
nRBC: 0 % (ref 0.0–0.2)

## 2022-07-12 LAB — LIPID PANEL
Cholesterol: 185 mg/dL (ref 0–200)
HDL: 103 mg/dL (ref >40)
LDL Cholesterol: 63 mg/dL (ref 0–99)
Total CHOL/HDL Ratio: 1.8 RATIO
Triglycerides: 93 mg/dL (ref <150)
VLDL: 19 mg/dL (ref 0–40)

## 2022-07-12 LAB — TSH: TSH: 1.915 u[IU]/mL (ref 0.350–4.500)

## 2022-07-12 LAB — POC SARS CORONAVIRUS 2 AG: SARSCOV2ONAVIRUS 2 AG: NEGATIVE

## 2022-07-12 LAB — POCT URINE DRUG SCREEN - MANUAL ENTRY (I-SCREEN)
POC Amphetamine UR: NOT DETECTED
POC Buprenorphine (BUP): NOT DETECTED
POC Cocaine UR: NOT DETECTED
POC Marijuana UR: NOT DETECTED
POC Methadone UR: NOT DETECTED
POC Methamphetamine UR: NOT DETECTED
POC Morphine: NOT DETECTED
POC Oxazepam (BZO): POSITIVE — AB
POC Oxycodone UR: NOT DETECTED
POC Secobarbital (BAR): NOT DETECTED

## 2022-07-12 LAB — HEMOGLOBIN A1C
Hgb A1c MFr Bld: 5.2 % (ref 4.8–5.6)
Mean Plasma Glucose: 102.54 mg/dL

## 2022-07-12 MED ORDER — HYDROXYZINE HCL 25 MG PO TABS: 25.0000 mg | ORAL_TABLET | Freq: Four times a day (QID) | ORAL | Status: DC | PRN

## 2022-07-12 MED ORDER — MIRTAZAPINE 7.5 MG PO TABS
7.5000 mg | ORAL_TABLET | Freq: Every day | ORAL | Status: DC
  Administered 2022-07-12 – 2022-07-13 (×2): 7.5 mg via ORAL
  Filled 2022-07-12 (×2): qty 1

## 2022-07-12 MED ORDER — LOPERAMIDE HCL 2 MG PO CAPS: 2.0000 mg | ORAL_CAPSULE | ORAL | Status: DC | PRN

## 2022-07-12 MED ORDER — ONDANSETRON 4 MG PO TBDP: 4.0000 mg | ORAL_TABLET | Freq: Four times a day (QID) | ORAL | Status: DC | PRN

## 2022-07-12 MED ORDER — TRAZODONE HCL 50 MG PO TABS
50.0000 mg | ORAL_TABLET | Freq: Every evening | ORAL | Status: DC | PRN
  Administered 2022-07-12 – 2022-07-13 (×2): 50 mg via ORAL
  Filled 2022-07-12 (×2): qty 1

## 2022-07-12 MED ORDER — LORAZEPAM 1 MG PO TABS
1.0000 mg | ORAL_TABLET | Freq: Three times a day (TID) | ORAL | Status: DC
Start: 2022-07-13 — End: 2022-07-12

## 2022-07-12 MED ORDER — HYDROXYZINE HCL 25 MG PO TABS
25.0000 mg | ORAL_TABLET | Freq: Three times a day (TID) | ORAL | Status: DC | PRN
  Administered 2022-07-12 – 2022-07-13 (×2): 25 mg via ORAL
  Filled 2022-07-12 (×2): qty 1

## 2022-07-12 MED ORDER — LORAZEPAM 1 MG PO TABS: 1.0000 mg | ORAL_TABLET | ORAL | Status: DC | PRN

## 2022-07-12 MED ORDER — ACETAMINOPHEN 325 MG PO TABS
650.0000 mg | ORAL_TABLET | Freq: Four times a day (QID) | ORAL | Status: DC | PRN
  Filled 2022-07-12: qty 2

## 2022-07-12 MED ORDER — LORAZEPAM 1 MG PO TABS
1.0000 mg | ORAL_TABLET | Freq: Two times a day (BID) | ORAL | Status: DC
Start: 2022-07-14 — End: 2022-07-12

## 2022-07-12 MED ORDER — LORAZEPAM 1 MG PO TABS
1.0000 mg | ORAL_TABLET | Freq: Four times a day (QID) | ORAL | Status: DC
  Filled 2022-07-12: qty 1

## 2022-07-12 MED ORDER — ADULT MULTIVITAMIN W/MINERALS CH
1.0000 | ORAL_TABLET | Freq: Every day | ORAL | Status: DC
Start: 2022-07-12 — End: 2022-07-14
  Administered 2022-07-12 – 2022-07-14 (×3): 1 via ORAL
  Filled 2022-07-12 (×3): qty 1

## 2022-07-12 MED ORDER — THIAMINE HCL 100 MG/ML IJ SOLN
100.0000 mg | Freq: Once | INTRAMUSCULAR | Status: AC
  Administered 2022-07-12: 100 mg via INTRAMUSCULAR
  Filled 2022-07-12: qty 2

## 2022-07-12 MED ORDER — ZIPRASIDONE MESYLATE 20 MG IM SOLR: 20.0000 mg | INTRAMUSCULAR | Status: DC | PRN

## 2022-07-12 MED ORDER — OLANZAPINE 5 MG PO TBDP: 5.0000 mg | ORAL_TABLET | Freq: Three times a day (TID) | ORAL | Status: DC | PRN

## 2022-07-12 MED ORDER — LORAZEPAM 1 MG PO TABS: 1.0000 mg | ORAL_TABLET | Freq: Four times a day (QID) | ORAL | Status: DC | PRN

## 2022-07-12 MED ORDER — LORAZEPAM 1 MG PO TABS: 1.0000 mg | ORAL_TABLET | Freq: Every day | ORAL | Status: DC

## 2022-07-12 MED ORDER — THIAMINE MONONITRATE 100 MG PO TABS
100.0000 mg | ORAL_TABLET | Freq: Every day | ORAL | Status: DC
Start: 2022-07-13 — End: 2022-07-14
  Administered 2022-07-13 – 2022-07-14 (×2): 100 mg via ORAL
  Filled 2022-07-12 (×2): qty 1

## 2022-07-12 MED ORDER — ALUM & MAG HYDROXIDE-SIMETH 200-200-20 MG/5ML PO SUSP: 30.0000 mL | ORAL | Status: DC | PRN

## 2022-07-12 MED ORDER — MAGNESIUM HYDROXIDE 400 MG/5ML PO SUSP: 30.0000 mL | Freq: Every day | ORAL | Status: DC | PRN

## 2022-07-12 NOTE — Progress Notes (Addendum)
Pt's CIWA was 1.

## 2022-07-12 NOTE — ED Provider Notes (Cosign Needed Addendum)
[facility] Progress Note  Date and Time: 07/12/2022 8:28 AM Name: Douglas Mata MRN:  [id number]  Reason For Admission: Douglas Mata is a 37 yo male w/ no pertinent past psychiatric history presenting to [facility] seeking alcohol detox and residential substance use treatment.  Subjective: Patient reports that he is experiencing some diaphoresis and tremors related to alcohol withdrawal but no other alcohol withdrawal symptoms at this time.  He reports history of depression and anxiety.  He reports symptoms of rest mood, anhedonia, low energy, poor concentration, appropriate appetite, poor sleep. He reports possible PTSD symptoms including flashbacks related to traumas he has experienced in the past but did not go into detail about nature of traumas.  He reports motivated to quit alcohol as he reports increasing frequencies of blacking out related to alcohol use, increased verbal altercations, and 2 DUI of which he has court next month for 1 of these charges.  He reports he wants to do this for his family as he has 4 children that he cares for and he cannot afford to continue abusing alcohol and blacking out. He is agreeable to MAT for alcohol use once his alcohol withdrawal symptoms subside.   Diagnosis:  Final diagnoses:  Alcohol abuse  Cocaine abuse ([place])  Behavior concern in adult    Total Time spent with patient: 45 minutes   Labs  Lab Results:     Latest Ref Rng & Units 07/12/2022    2:40 AM 05/19/2020    2:45 PM 03/02/2020    7:39 PM  CBC  WBC 4.0 - 10.5 K/uL 4.0  4.8  6.0   Hemoglobin 13.0 - 17.0 g/dL 14.0  13.7  9.8   Hematocrit 39.0 - 52.0 % 39.3  39.8  28.1   Platelets 150 - 400 K/uL 232  179  204       Latest Ref Rng & Units 07/12/2022    2:40 AM 05/19/2020    2:45 PM 02/28/2020    9:45 PM  CMP  Glucose 70 - 99 mg/dL 72  84  83   BUN 6 - 20 mg/dL 8  10  13   $ Creatinine 0.61 - 1.24 mg/dL 0.87  0.99  1.33   Sodium 135 - 145 mmol/L 141  138  141   Potassium 3.5 - 5.1  mmol/L 3.9  3.5  3.4   Chloride 98 - 111 mmol/L 101  105  104   CO2 22 - 32 mmol/L 27  21  23   $ Calcium 8.9 - 10.3 mg/dL 9.6  8.8  9.1   Total Protein 6.5 - 8.1 g/dL 6.7  6.4    Total Bilirubin 0.3 - 1.2 mg/dL 0.4  0.2    Alkaline Phos 38 - 126 U/L 49  51    AST 15 - 41 U/L 37  28    ALT 0 - 44 U/L 30  17      Physical Findings   Flowsheet Row ED from 07/12/2022 in [facility] ED from 08/08/2021 in [facility] Emergency Department at [place] No Risk No Risk        Musculoskeletal  Strength & Muscle Tone: within normal limits Gait & Station: normal Patient leans: N/A  Psychiatric Specialty Exam  Presentation  General Appearance:  Casual   Eye Contact: Good   Speech: Clear and Coherent   Speech Volume: Decreased   Handedness: Right    Mood and Affect  Mood: Anxious  Affect: Appropriate    Thought Process  Thought Processes: Coherent   Descriptions of Associations:Circumstantial   Orientation:Full (Time, Place and Person)   Thought Content:Logical   Diagnosis of Schizophrenia or Schizoaffective disorder in past: No     Hallucinations:Hallucinations: None   Ideas of Reference:None   Suicidal Thoughts:Suicidal Thoughts: No   Homicidal Thoughts:Homicidal Thoughts: No    Sensorium  Memory: Immediate Fair   Judgment: Poor   Insight: Fair    Materials engineer: Fair   Attention Span: Fair   Recall: [place] of Knowledge: Fair   Language: Fair    Psychomotor Activity  Psychomotor Activity: Psychomotor Activity: Normal    Assets  Assets: Desire for Improvement; Resilience; Talents/Skills    Sleep  Sleep: Sleep: Fair    Physical Exam  Physical Exam Vitals and nursing note reviewed.  Constitutional:      General: He is not in acute distress.    Appearance: He is well-developed. He is diaphoretic.  HENT:      Head: Normocephalic and atraumatic.  Eyes:     Conjunctiva/sclera: Conjunctivae normal.  Cardiovascular:     Rate and Rhythm: Normal rate and regular rhythm.     Heart sounds: No murmur heard. Pulmonary:     Effort: Pulmonary effort is normal. No respiratory distress.     Breath sounds: Normal breath sounds.  Abdominal:     Palpations: Abdomen is soft.     Tenderness: There is no abdominal tenderness.  Musculoskeletal:        General: No swelling.     Cervical back: Neck supple.  Skin:    General: Skin is warm.     Capillary Refill: Capillary refill takes less than 2 seconds.  Neurological:     Mental Status: He is alert.  Psychiatric:        Mood and Affect: Mood normal.    Review of Systems  Constitutional:  Positive for diaphoresis.  Respiratory:  Negative for shortness of breath.   Cardiovascular:  Negative for chest pain.  Gastrointestinal:  Negative for abdominal pain, constipation, diarrhea, heartburn, nausea and vomiting.  Neurological:  Negative for headaches.   Blood pressure (!) 144/91, pulse 84, temperature (!) 97.5 F (36.4 C), temperature source Oral, resp. rate 18, SpO2 100 %. There is no height or weight on file to calculate BMI.  ASSESSMENT Douglas Mata is a 37 yo male w/ no pertinent past psychiatric history presenting to [facility] seeking alcohol detox and residential substance use treatment.  

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
## 2022-07-12 NOTE — Progress Notes (Signed)
Pt is currently in the dayroom watching TV and eating lunch. No signs of acute distress noted. Staff will monitor for pt's safety.

## 2022-07-12 NOTE — ED Notes (Signed)
Patient observed/assessed in room in bed appearing in no immediate distress resting peacefully. Q15 minute checks continued by MHT and nursing staff. Will continue to monitor and support. 

## 2022-07-12 NOTE — Progress Notes (Signed)
Pt is asleep. Respirations are even and unlabored. No distress noted. Staff will monitor for pt's safety. 

## 2022-07-12 NOTE — ED Notes (Signed)
Patient observed/assessed at nursing station. Patient alert and oriented x 4. Affect is bright. Patient denies pain and anxiety. He denies A/V/H. He denies having any thoughts/plan of self harm and harm towards others. Fluid and snack offered. Patient states that appetite has been good throughout the day. Last BM was today 07/12/22. Verbalizes no further complaints at this time. Will continue to monitor and support.

## 2022-07-12 NOTE — ED Triage Notes (Signed)
Pt presents to [facility] voluntarily, accompanied by a friend requesting substance/alcohol treatment. Pt reports drinking daily (wine) and cocaine occassionally since 2018. Pt reports blacking out due to excessive drinking sometimes. 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 Pt stated " I just seen so many things I should not have seen". Pt denies SI, HI, AVH.

## 2022-07-12 NOTE — ED Provider Notes (Signed)
Facility Based Crisis Admission H&P  Date: 07/12/22 Patient Name: Douglas Mata MRN: [id number] Chief Complaint: alcohol abuse  Diagnoses:  Final diagnoses:  Alcohol abuse  Cocaine abuse [facility])  Behavior concern in adult    HPI: Douglas Mata, 37 y/o male with a history of Alcohol abuse, PTSD  and cocaine use,  presented to [facility] voluntarily accompanied by his girlfriends.  Per the patient "I'm here because I have drug and alcohol problem.  Per the patient he has been using alcohol every day since 2018.  Pt girlfriend stated that patient who drink till he past out.  Last time patient consume alcohol was yesterday.  Pt also reported that he use cocaine a few day ago.  Patient denies having a psychiatrist or therapist at this time patient also denies taking any prescriptive medication.  According to patient he currently has court dates coming up March 20 patient also stated he went to court today for pending charges against him.  Copied from TTS triage notes, Pt presents to [facility] voluntarily, accompanied by a friend requesting substance/alcohol treatment. Pt reports drinking daily (wine) and cocaine occassionally since 2018. Pt reports blacking out due to excessive drinking sometimes. 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 Pt stated " I just seen so many things I should not have seen". Pt denies SI, HI, AVH. Pt does have a hx of attempted suicide becaue of drugs. He had tried to jump out of a vehicle a year ago. No actual sober suicide attempts. Patient denies any access to weapons. He usually uses ETOH and cocaine. Last use of ETOH was around 23:00 tonight he had a 16oz glass of wine.   Face-to-face observation of patient patient is alert and oriented x 4 speech is clear maintain eye contact.  Patient is very calm answer all questions appropriately.  Patient denies SI, HI, AVH or paranoia.  Patient reports drinking alcohol on a daily basis could not state how  much she drinks he drinks most times until he passed out.  Or he gets into trouble.  Patient reports he used cocaine on a regular basis denies using any other illicit drugs.  Patient denies ever being to a detox facility for treatment.  Writer explained to patient the process, patient in agreement with plan of care   Recommend [facility] admission  PHQ 2-9:   [place] ED from 07/12/2022 in [facility] ED from 08/08/2021 in [facility] Emergency Department at [place] No Risk No Risk        Total Time spent with patient: 30 minutes  Musculoskeletal  Strength & Muscle Tone: within normal limits Gait & Station: normal Patient leans: N/A  Psychiatric Specialty Exam  Presentation General Appearance:  Casual  Eye Contact: Good  Speech: Clear and Coherent  Speech Volume: Decreased  Handedness: Right   Mood and Affect  Mood: Anxious  Affect: Appropriate   Thought Process  Thought Processes: Coherent  Descriptions of Associations:Circumstantial  Orientation:Full (Time, Place and Person)  Thought Content:Logical    Hallucinations:Hallucinations: None  Ideas of Reference:None  Suicidal Thoughts:Suicidal Thoughts: No  Homicidal Thoughts:Homicidal Thoughts: No   Sensorium  Memory: Immediate Fair  Judgment: Poor  Insight: Fair   Materials engineer: Fair  Attention Span: Fair  Recall: [place] of Knowledge: Fair  Language: Fair   Psychomotor Activity  Psychomotor Activity: Psychomotor Activity: Normal   Assets  Assets:  Desire for Improvement; Resilience; Talents/Skills   Sleep  Sleep: Sleep: Fair   Nutritional Assessment (For OBS and FBC admissions only) Has the patient had a weight loss or gain of 10 pounds or more in the last 3 months?: No Has the patient had a decrease in food intake/or appetite?: No Does the patient have dental problems?:  No Does the patient have eating habits or behaviors that may be indicators of an eating disorder including binging or inducing vomiting?: No Has the patient recently lost weight without trying?: 0 Has the patient been eating poorly because of a decreased appetite?: 0 Malnutrition Screening Tool Score: 0    Physical Exam HENT:     Head: Normocephalic.     Nose: Nose normal.  Cardiovascular:     Rate and Rhythm: Normal rate.  Pulmonary:     Effort: Pulmonary effort is normal.  Musculoskeletal:        General: Normal range of motion.     Cervical back: Normal range of motion.  Skin:    General: Skin is warm.  Neurological:     General: No focal deficit present.     Mental Status: He is alert.  Psychiatric:        Mood and Affect: Mood normal.        Behavior: Behavior normal.        Thought Content: Thought content normal.        Judgment: Judgment normal.    Review of Systems  Constitutional: Negative.   HENT: Negative.    Eyes: Negative.   Respiratory: Negative.    Cardiovascular: Negative.   Gastrointestinal: Negative.   Genitourinary: Negative.   Musculoskeletal: Negative.   Neurological: Negative.   Psychiatric/Behavioral:  Positive for substance abuse. The patient is nervous/anxious.     Blood pressure (!) 140/99, pulse 88, temperature 97.9 F (36.6 C), temperature source Oral, resp. rate 18, SpO2 100 %. There is no height or weight on file to calculate BMI.  Past Psychiatric History: alcohol abuse,  PTSD,  Cocaine abuse   Is the patient at risk to self? No  Has the patient been a risk to self in the past 6 months? No .    Has the patient been a risk to self within the distant past? No   Is the patient a risk to others? No   Has the patient been a risk to others in the past 6 months? No   Has the patient been a risk to others within the distant past? No   Past Medical History: see chart Family History: unknown Social History: alcohol and cocaine   Last  Labs:  No visits with results within 6 Month(s) from this visit.  Latest known visit with results is:  Admission on 05/19/2020, Discharged on 05/19/2020  Component Date Value Ref Range Status   WBC 05/19/2020 4.8  4.0 - 10.5 K/uL Final   RBC 05/19/2020 4.27  4.22 - 5.81 MIL/uL Final   Hemoglobin 05/19/2020 13.7  13.0 - 17.0 g/dL Final   HCT 05/19/2020 39.8  39.0 - 52.0 % Final   MCV 05/19/2020 93.2  80.0 - 100.0 fL Final   MCH 05/19/2020 32.1  26.0 - 34.0 pg Final   MCHC 05/19/2020 34.4  30.0 - 36.0 g/dL Final   RDW 05/19/2020 13.3  11.5 - 15.5 % Final   Platelets 05/19/2020 179  150 - 400 K/uL Final   nRBC 05/19/2020 0.0  0.0 - 0.2 % Final   Neutrophils Relative %  05/19/2020 34  % Final   Neutro Abs 05/19/2020 1.6 (L)  1.7 - 7.7 K/uL Final   Lymphocytes Relative 05/19/2020 50  % Final   Lymphs Abs 05/19/2020 2.4  0.7 - 4.0 K/uL Final   Monocytes Relative 05/19/2020 12  % Final   Monocytes Absolute 05/19/2020 0.6  0.1 - 1.0 K/uL Final   Eosinophils Relative 05/19/2020 4  % Final   Eosinophils Absolute 05/19/2020 0.2  0.0 - 0.5 K/uL Final   Basophils Relative 05/19/2020 0  % Final   Basophils Absolute 05/19/2020 0.0  0.0 - 0.1 K/uL Final   Immature Granulocytes 05/19/2020 0  % Final   Abs Immature Granulocytes 05/19/2020 0.01  0.00 - 0.07 K/uL Final   Performed at [facility], [place] [address], [place], [place] [address]   Alcohol, Ethyl (B) 05/19/2020 40 (H)  <10 mg/dL Final   Comment: (NOTE) Lowest detectable limit for serum alcohol is 10 mg/dL.  For medical purposes only. Performed at [facility], [place] [address] [place], [place] [address]    Sodium 05/19/2020 138  135 - 145 mmol/L Final   Potassium 05/19/2020 3.5  3.5 - 5.1 mmol/L Final   Chloride 05/19/2020 105  98 - 111 mmol/L Final   CO2 05/19/2020 21 (L)  22 - 32 mmol/L Final   Glucose, Bld 05/19/2020 84  70 - 99 mg/dL Final   Glucose reference range applies only to samples taken after fasting for at least  8 hours.   BUN 05/19/2020 10  6 - 20 mg/dL Final   Creatinine, Ser 05/19/2020 0.99  0.61 - 1.24 mg/dL Final   Calcium 05/19/2020 8.8 (L)  8.9 - 10.3 mg/dL Final   Total Protein 05/19/2020 6.4 (L)  6.5 - 8.1 g/dL Final   Albumin 05/19/2020 3.8  3.5 - 5.0 g/dL Final   AST 05/19/2020 28  15 - 41 U/L Final   ALT 05/19/2020 17  0 - 44 U/L Final   Alkaline Phosphatase 05/19/2020 51  38 - 126 U/L Final   Total Bilirubin 05/19/2020 0.2 (L)  0.3 - 1.2 mg/dL Final   GFR, Estimated 05/19/2020 >60  >60 mL/min Final   Comment: (NOTE) Calculated using the CKD-EPI Creatinine Equation (2021)    Anion gap 05/19/2020 12  5 - 15 Final   Performed at [facility] [address], [place],  [address]   SARS Coronavirus 2 by RT PCR 05/19/2020 NEGATIVE  NEGATIVE Final   Comment: (NOTE) SARS-CoV-2 target nucleic acids are NOT DETECTED.  The SARS-CoV-2 RNA is generally detectable in upper respiratory specimens during the acute phase of infection. The lowest concentration of SARS-CoV-2 viral copies this assay can detect is 138 copies/mL. A negative result does not preclude SARS-Cov-2 infection and should not be used as the sole basis for treatment or other patient management decisions. A negative result may occur with  improper specimen collection/handling, submission of specimen other than nasopharyngeal swab, presence of viral mutation(s) within the areas targeted by this assay, and inadequate number of viral copies(<138 copies/mL). A negative result must be combined with clinical observations, patient history, and epidemiological information. The expected result is Negative.  Fact Sheet for Patients:  EntrepreneurPulse.com.au  Fact Sheet for Healthcare Providers:  IncredibleEmployment.be  This test is no                          t yet approved or cleared by the [place] FDA  and  has been authorized for detection and/or diagnosis of SARS-CoV-2  by FDA under an Emergency Use Authorization (EUA). This EUA will remain  in effect (meaning this test can be used) for the duration of the COVID-19 declaration under Section 564(b)(1) of the Act, 21 U.S.C.section 360bbb-3(b)(1), unless the authorization is terminated  or revoked sooner.       Influenza A by PCR 05/19/2020 NEGATIVE  NEGATIVE Final   Influenza B by PCR 05/19/2020 NEGATIVE  NEGATIVE Final   Comment: (NOTE) The Xpert Xpress SARS-CoV-2/FLU/RSV plus assay is intended as an aid in the diagnosis of influenza from Nasopharyngeal swab specimens and should not be used as a sole basis for treatment. Nasal washings and aspirates are unacceptable for Xpert Xpress SARS-CoV-2/FLU/RSV testing.  Fact Sheet for Patients: EntrepreneurPulse.com.au  Fact Sheet for Healthcare Providers: IncredibleEmployment.be  This test is not yet approved or cleared by the [place] FDA and has been authorized for detection and/or diagnosis of SARS-CoV-2 by FDA under an Emergency Use Authorization (EUA). This EUA will remain in effect (meaning this test can be used) for the duration of the COVID-19 declaration under Section 564(b)(1) of the Act, 21 U.S.C. section 360bbb-3(b)(1), unless the authorization is terminated or revoked.  Performed at [facility], [place] [address], [place], [place] [address]     Allergies: Patient has no known allergies.  Medications:  PTA Medications  Medication Sig   aspirin-acetaminophen-caffeine (EXCEDRIN MIGRAINE) 250-250-65 MG tablet Take 2 tablets by mouth every 6 (six) hours as needed for headache.   polyvinyl alcohol (LIQUIFILM TEARS) 1.4 % ophthalmic solution Place 1 drop into both eyes as needed for dry eyes.   diphenhydrAMINE (BENADRYL) 2 % cream Apply 1 application topically 3 (three) times daily as needed for itching (back rash).   predniSONE (DELTASONE) 10 MG tablet Take 9m (4 tablets) for 4 days, 341m(3  tablets) for 3 days, 2 tablets for 2 days then one tablet for 2 days.   hydrOXYzine (ATARAX/VISTARIL) 25 MG tablet Take 1 tablet (25 mg total) by mouth every 6 (six) hours as needed for itching.    Long Term Goals: Improvement in symptoms so as ready for discharge  Short Term Goals: Patient will verbalize feelings in meetings with treatment team members., Patient will attend at least of 50% of the groups daily., Pt will complete the PHQ9 on admission, day 3 and discharge., Patient will participate in completing the [place] will score a low risk of violence for 24 hours prior to discharge, and Patient will take medications as prescribed daily.  Medical Decision Making  [facility]     Recommendations  Based on my evaluation the patient appears to have an emergency medical condition for which I recommend the patient be transferred to the emergency department for further evaluation.  RoEvette GeorgesNP 07/12/22  1:57 AM

## 2022-07-12 NOTE — Progress Notes (Signed)
Pt is alert and oriented X4. Pt did not voice any complaints of pain or discomfort. No signs of acute distress noted. Pt denies SI/HI/AVH, plan or intent. Staff will monitor for pt's safety.

## 2022-07-12 NOTE — ED Notes (Signed)
Pt admitted to [facility] requesting for detox from alcohol. Patient was cooperative during the admission assessment. Skin assessment complete. Belongings in the locker. Patient oriented to unit and unit rules. Meal and drinks offered to patient.  Patient verbalized agreement to treatment plans. Patient verbally contracts for safety while hospitalized. Will monitor for safety.

## 2022-07-12 NOTE — BH IP Treatment Plan (Signed)
Interdisciplinary Treatment and Diagnostic Plan Update  07/12/2022 Time of Session: 9:00AM Douglas Mata MRN: [id number]  Diagnosis:  Final diagnoses:  Alcohol abuse  Cocaine abuse ([place])  Behavior concern in adult     Current Medications:  Current Facility-Administered Medications  Medication Dose Route Frequency Provider Last Rate Last Admin   acetaminophen (TYLENOL) tablet 650 mg  650 mg Oral Q6H PRN [name], NP       alum & mag hydroxide-simeth (MAALOX/MYLANTA) 200-200-20 MG/5ML suspension 30 mL  30 mL Oral Q4H PRN [name], NP       hydrOXYzine (ATARAX) tablet 25 mg  25 mg Oral TID PRN France Ravens, MD       loperamide (IMODIUM) capsule 2-4 mg  2-4 mg Oral PRN [name], NP       LORazepam (ATIVAN) tablet 1 mg  1 mg Oral Q6H PRN [name], NP       magnesium hydroxide (MILK OF MAGNESIA) suspension 30 mL  30 mL Oral Daily PRN [name], NP       mirtazapine (REMERON) tablet 7.5 mg  7.5 mg Oral QHS France Ravens, MD       multivitamin with minerals tablet 1 tablet  1 tablet Oral Daily [name], NP   1 tablet at 07/12/22 0923   ondansetron (ZOFRAN-ODT) disintegrating tablet 4 mg  4 mg Oral Q6H PRN [name], NP       Derrill Memo ON 07/13/2022] thiamine (VITAMIN B1) tablet 100 mg  100 mg Oral Daily [name], NP       traZODone (DESYREL) tablet 50 mg  50 mg Oral QHS PRN France Ravens, MD       Current Outpatient Medications  Medication Sig Dispense Refill   aspirin-acetaminophen-caffeine (EXCEDRIN MIGRAINE) 250-250-65 MG tablet Take 2 tablets by mouth every 6 (six) hours as needed for headache.     diphenhydrAMINE (BENADRYL) 2 % cream Apply 1 application topically 3 (three) times daily as needed for itching (back rash).     polyvinyl alcohol (LIQUIFILM TEARS) 1.4 % ophthalmic solution Place 1 drop into both eyes as needed for dry eyes.     PTA Medications: Prior to Admission medications   Medication Sig Start Date End Date Taking? Authorizing Provider   aspirin-acetaminophen-caffeine (EXCEDRIN MIGRAINE) [phone] MG tablet Take 2 tablets by mouth every 6 (six) hours as needed for headache.    [provider]  diphenhydrAMINE (BENADRYL) 2 % cream Apply 1 application topically 3 (three) times daily as needed for itching (back rash).    [provider]  polyvinyl alcohol (LIQUIFILM TEARS) 1.4 % ophthalmic solution Place 1 drop into both eyes as needed for dry eyes.    [provider]    Patient Stressors: Legal issue   Substance abuse    Patient Strengths: Ability for insight  Average or above average intelligence  Capable of independent living  Communication skills  General fund of knowledge  Motivation for treatment/growth  Supportive family/friends   Treatment Modalities: Medication Management, Group therapy, Case management,  1 to 1 session with clinician, Psychoeducation, Recreational therapy.   Physician Treatment Plan for Primary and Secondary Diagnosis:  Final diagnoses:  Alcohol abuse  Cocaine abuse ([place])  Behavior concern in adult   Long Term Goal(s): Improvement in symptoms so as ready for discharge  Short Term Goals: Patient will verbalize feelings in meetings with treatment team members. Patient will attend at least of 50% of the groups daily. Pt will complete the PHQ9 on admission, day  3 and discharge. Patient will participate in completing the [place] Patient will score a low risk of violence for 24 hours prior to discharge Patient will take medications as prescribed daily.  Medication Management: Evaluate patient's response, side effects, and tolerance of medication regimen.  Therapeutic Interventions: 1 to 1 sessions, Unit Group sessions and Medication administration.  Evaluation of Outcomes: Progressing  LCSW Treatment Plan for Primary Diagnosis:  Final diagnoses:  Alcohol abuse  Cocaine abuse ([place])  Behavior concern in adult    Long Term  Goal(s): Safe transition to appropriate next level of care at discharge.  Short Term Goals: Facilitate acceptance of mental health diagnosis and concerns through verbal commitment to aftercare plan and appointments at discharge., Patient will identify one social support prior to discharge to aid in patient's recovery., Patient will attend AA/NA groups as scheduled., Identify minimum of 2 triggers associated with mental health/substance abuse issues with treatment team members., and Increase skills for wellness and recovery by attending 50% of scheduled groups.  Therapeutic Interventions: Assess for all discharge needs, 1 to 1 time with Education officer, museum, Explore available resources and support systems, Assess for adequacy in community support network, Educate family and significant other(s) on suicide prevention, Complete Psychosocial Assessment, Interpersonal group therapy.  Evaluation of Outcomes: Progressing   Progress in Treatment: Attending groups: Yes. Participating in groups: Yes. Taking medication as prescribed: Yes. Toleration medication: Yes. Family/Significant other contact made: No, will contact:  patient declined collateral at this time.  Patient understands diagnosis: Yes. Discussing patient identified problems/goals with staff: Yes. Medical problems stabilized or resolved: Yes. Denies suicidal/homicidal ideation: Yes. Issues/concerns per patient self-inventory: Yes. Other: alcohol use and need for treatment.   New problem(s) identified: No, Describe:  other than reported on admission.   New Short Term/Long Term Goal(s): Safe transition to appropriate next level of care at discharge, Engage patient in therapeutic group addressing interpersonal concerns. Engage patient in aftercare planning with referrals and resources, Increase ability to appropriately verbalize feelings, Facilitate acceptance of mental health diagnosis and concerns and Identify triggers associated with mental  health/substance abuse issues.    Patient Goals:  Patient is seeking residential placement at this time for substance use.   Discharge Plan or Barriers: LCSW will send referrals out for review for residential placement. Updates will be provided as received.   Reason for Continuation of Hospitalization: Withdrawal symptoms and medication stabilization.   Estimated Length of Stay: 3-5 days  Last 3 [place] Suicide Severity Risk Score: Flowsheet Row ED from 07/12/2022 in [facility] ED from 08/08/2021 in [facility] Emergency Department at [place] No Risk No Risk       Last [facility] 2/9 Scores:     No data to display          Scribe for Treatment Team: Gigi Gin 07/12/2022 9:56 AM

## 2022-07-12 NOTE — Group Note (Signed)
Group Topic: Relaxation  Group Date: 07/12/2022 Start Time: 1030 End Time: 1100 Facilitators: Quentin Cornwall B  Department: [facility]  Number of Participants: 6  Group Focus: relaxation Treatment Modality:  Psychoeducation Interventions utilized were other Pet Therapy Purpose: release tension   Name: Douglas Mata Date of Birth: 07/19/1985  MR: [id number]    Level of Participation: moderate Quality of Participation: attentive and cooperative Interactions with others: gave feedback Mood/Affect: positive Triggers (if applicable): na Cognition: coherent/clear Progress: Moderate Response: na Plan: follow-up needed  Patients Problems:  Patient Active Problem List   Diagnosis Date Noted   Alcohol abuse 07/12/2022

## 2022-07-13 DIAGNOSIS — Z765 Malingerer [conscious simulation]: Secondary | ICD-10-CM | POA: Diagnosis not present

## 2022-07-13 DIAGNOSIS — Z1152 Encounter for screening for COVID-19: Secondary | ICD-10-CM | POA: Diagnosis not present

## 2022-07-13 DIAGNOSIS — F1021 Alcohol dependence, in remission: Secondary | ICD-10-CM | POA: Diagnosis not present

## 2022-07-13 DIAGNOSIS — F1410 Cocaine abuse, uncomplicated: Secondary | ICD-10-CM | POA: Diagnosis not present

## 2022-07-13 MED ORDER — NALTREXONE HCL 50 MG PO TABS
50.0000 mg | ORAL_TABLET | Freq: Every day | ORAL | Status: DC
  Administered 2022-07-14: 50 mg via ORAL
  Filled 2022-07-13: qty 1

## 2022-07-13 MED ORDER — NICOTINE POLACRILEX 2 MG MT GUM
2.0000 mg | CHEWING_GUM | OROMUCOSAL | Status: DC | PRN
  Administered 2022-07-13: 2 mg via ORAL
  Filled 2022-07-13: qty 1

## 2022-07-13 NOTE — ED Notes (Signed)
Patient  is requesting nicorette gum . R will notify provider.

## 2022-07-13 NOTE — ED Notes (Signed)
Pt sleeping, RR even and unlabored. Will continue to monitor for safety

## 2022-07-13 NOTE — ED Notes (Signed)
Patient is watching tv in the dayroom. Alert and oriented. Will continue to monitor for safety.

## 2022-07-13 NOTE — ED Notes (Signed)
Patient alert and oriented x 3. Denies SI/HI/AVH. Denies intent or plan to harm self or others. Routine conducted according to faculty protocol. Encourage patient to notify staff with any needs or concerns. Patient verbalized agreement and understanding. Will continue to monitor for safety. 

## 2022-07-13 NOTE — ED Provider Notes (Signed)
FBC Progress Note  Date and Time: 07/13/2022 10:42 AM Name: Douglas Mata MRN:  [id number]  Reason For Admission: Douglas Mata is a 37 yo male w/ no pertinent past psychiatric history presenting to [facility] seeking alcohol detox and residential substance use treatment.  Subjective: Seen and assessed at bedside. Denies SI/HI/AVH. Eating continues to be stable. Sleep has improved since starting remeron. States no longer diaphoretic this morning. Overall feels alcohol withdrawal symptoms have improved. Discussed starting naltrexone tomorrow and he was agreeable. He denies opiate use and liver function appears appropriate.   Diagnosis:  Final diagnoses:  Alcohol abuse  Cocaine abuse ([place])  Behavior concern in adult    Total Time spent with patient: 45 minutes   Labs  Lab Results:     Latest Ref Rng & Units 07/12/2022    2:40 AM 05/19/2020    2:45 PM 03/02/2020    7:39 PM  CBC  WBC 4.0 - 10.5 K/uL 4.0  4.8  6.0   Hemoglobin 13.0 - 17.0 g/dL 14.0  13.7  9.8   Hematocrit 39.0 - 52.0 % 39.3  39.8  28.1   Platelets 150 - 400 K/uL 232  179  204       Latest Ref Rng & Units 07/12/2022    2:40 AM 05/19/2020    2:45 PM 02/28/2020    9:45 PM  CMP  Glucose 70 - 99 mg/dL 72  84  83   BUN 6 - 20 mg/dL 8  10  13   $ Creatinine 0.61 - 1.24 mg/dL 0.87  0.99  1.33   Sodium 135 - 145 mmol/L 141  138  141   Potassium 3.5 - 5.1 mmol/L 3.9  3.5  3.4   Chloride 98 - 111 mmol/L 101  105  104   CO2 22 - 32 mmol/L 27  21  23   $ Calcium 8.9 - 10.3 mg/dL 9.6  8.8  9.1   Total Protein 6.5 - 8.1 g/dL 6.7  6.4    Total Bilirubin 0.3 - 1.2 mg/dL 0.4  0.2    Alkaline Phos 38 - 126 U/L 49  51    AST 15 - 41 U/L 37  28    ALT 0 - 44 U/L 30  17      Physical Findings   PHQ2-9    Flowsheet Row ED from 07/12/2022 in [facility]  PHQ-2 Total Score 6      [place] ED from 07/12/2022 in [facility] ED from 08/08/2021 in [facility]  Emergency Department at [place] No Risk No Risk        Musculoskeletal  Strength & Muscle Tone: within normal limits Gait & Station: normal Patient leans: N/A  Psychiatric Specialty Exam  Presentation  General Appearance:  Casual   Eye Contact: Good   Speech: Clear and Coherent   Speech Volume: Decreased   Handedness: Right    Mood and Affect  Mood: Anxious   Affect: Appropriate    Thought Process  Thought Processes: Coherent   Descriptions of Associations:Circumstantial   Orientation:Full (Time, Place and Person)   Thought Content:Logical   Diagnosis of Schizophrenia or Schizoaffective disorder in past: No     Hallucinations:Hallucinations: None   Ideas of Reference:None   Suicidal Thoughts:Suicidal Thoughts: No   Homicidal Thoughts:Homicidal Thoughts: No    Sensorium  Memory: Immediate Fair   Judgment: Poor   Insight: Fair  Executive Functions  Concentration: Fair   Attention Span: Fair   Recall: [employer] of Knowledge: Fair   Language: Fair    Psychomotor Activity  Psychomotor Activity: Psychomotor Activity: Normal    Assets  Assets: Desire for Improvement; Resilience; Talents/Skills    Sleep  Sleep: Sleep: Fair    Physical Exam  Physical Exam Vitals and nursing note reviewed.  Constitutional:      General: He is not in acute distress.    Appearance: He is well-developed. He is not diaphoretic.  HENT:     Head: Normocephalic and atraumatic.  Eyes:     Conjunctiva/sclera: Conjunctivae normal.  Cardiovascular:     Rate and Rhythm: Normal rate and regular rhythm.     Heart sounds: No murmur heard. Pulmonary:     Effort: Pulmonary effort is normal. No respiratory distress.     Breath sounds: Normal breath sounds.  Abdominal:     Palpations: Abdomen is soft.     Tenderness: There is no abdominal tenderness.  Musculoskeletal:         General: No swelling.     Cervical back: Neck supple.  Skin:    General: Skin is warm.     Capillary Refill: Capillary refill takes less than 2 seconds.  Neurological:     Mental Status: He is alert.  Psychiatric:        Mood and Affect: Mood normal.    Review of Systems  Constitutional:  Negative for diaphoresis.  Respiratory:  Negative for shortness of breath.   Cardiovascular:  Negative for chest pain.  Gastrointestinal:  Negative for abdominal pain, constipation, diarrhea, heartburn, nausea and vomiting.  Neurological:  Negative for headaches.   Blood pressure (!) 135/94, pulse 65, temperature 97.6 F (36.4 C), resp. rate 17, SpO2 100 %. There is no height or weight on file to calculate BMI.  ASSESSMENT Douglas Mata is a 37 yo male w/ no pertinent past psychiatric history presenting to [facility] seeking alcohol detox and residential substance use treatment.  PLAN Alcohol use disorder with alcohol-induced mood disorder Generalized anxiety disorder Last Drink 07/11/22 -CIWA w/ ativan PRN -Continue mirtazapine 7.5 mg qhs for depression symptoms -MVI/thiamine -Hydroxyzine 25 mg 3 times daily as needed for anxiety -Trazodone 50 mg nightly as needed for sleep -Start Naltrexone 50 mg daily tomorrow for MAT  Dispo: Residential Substance Use Treatment   France Ravens, MD 07/13/2022 10:42 AM

## 2022-07-13 NOTE — ED Notes (Signed)
Patient  sleeping in no acute stress. RR even and unlabored .Environment secured .Will continue to monitor for safely. 

## 2022-07-13 NOTE — Group Note (Signed)
Group Topic: Recovery Basics  Group Date: 07/13/2022 Start Time: 1010 End Time: 1105 Facilitators: Quentin Cornwall B  Department: [facility]  Number of Participants: 5  Group Focus: self-awareness Treatment Modality:  Psychoeducation Interventions utilized were group exercise Purpose: regain self-worth  Name: Rodolfo Shertzer Date of Birth: Oct 10, 1985  MR: [id number]    Level of Participation: moderate Quality of Participation: attentive and cooperative Interactions with others: gave feedback Mood/Affect: positive Triggers (if applicable): NA Cognition: coherent/clear Progress: Moderate Response: NA Plan: follow-up needed  Patients Problems:  Patient Active Problem List   Diagnosis Date Noted   Alcohol abuse 07/12/2022

## 2022-07-13 NOTE — ED Notes (Signed)
Patient attended group today. We worked on Warden/ranger .

## 2022-07-13 NOTE — ED Notes (Signed)
Pt had bedtime snack.

## 2022-07-13 NOTE — BHH Group Notes (Signed)
[facility] LCSW Group Therapy   Date/ Time: 07/13/2022 at 1:30PM  Type of Therapy:  Group Therapy  Participation Level:  Active  Participation Quality:  Appropriate  Affect:  Appropriate  Cognitive:  Appropriate  Insight:  Developing/Improving  Engagement in Therapy:  Developing/Improving  Modes of Intervention:  Activity, Discussion, Rapport Building, Socialization and Support  Summary of Progress/Problems: Patient actively participated in group on today. Group started off with introductions and group rules. Group members participated in a therapeutic activity that required active listening and communication skills. Group members were able to identify similarities and differences within the group. Patient interacted positively with staff and peers. No issues to report.   Lucius Conn, LCSW Clinical Social Worker [facility] Ph: [phone]

## 2022-07-13 NOTE — ED Notes (Signed)
Patient requested nicorette gum.

## 2022-07-13 NOTE — ED Notes (Signed)
Patient is in the group room with staff having a group meeting. Alert,will continue to monitor for safety.

## 2022-07-13 NOTE — ED Notes (Addendum)
Patient is watching tv in the dayroom. Alert and oriented. Will continue to monitor for safety.

## 2022-07-13 NOTE — Discharge Planning (Addendum)
Referral was received and per Enis Gash, patient has been accepted and can transfer to the facility on Monday 07/17/2022 by 9:00am. Update has been provided to the patient and MD made aware. Patient will need a 14-30 day supply of medication and one month refill. No nicotine gum allowed, however 14-30 day nicotine patches to be provided if needed. No other needs to report at this time.    LCSW will continue to follow up and provide updates as received.    Lucius Conn, LCSW Clinical Social Worker [facility] Ph: [phone]

## 2022-07-14 ENCOUNTER — Ambulatory Visit (HOSPITAL_COMMUNITY)
Admission: EM | Admit: 2022-07-14 | Discharge: 2022-07-14 | Disposition: A | Discharge status: Home / Self Care | Payer: Medicaid Other

## 2022-07-14 DIAGNOSIS — Z1152 Encounter for screening for COVID-19: Secondary | ICD-10-CM | POA: Diagnosis not present

## 2022-07-14 DIAGNOSIS — Z765 Malingerer [conscious simulation]: Secondary | ICD-10-CM | POA: Diagnosis not present

## 2022-07-14 DIAGNOSIS — F1094 Alcohol use, unspecified with alcohol-induced mood disorder: Secondary | ICD-10-CM

## 2022-07-14 DIAGNOSIS — F1410 Cocaine abuse, uncomplicated: Secondary | ICD-10-CM | POA: Diagnosis not present

## 2022-07-14 DIAGNOSIS — F1021 Alcohol dependence, in remission: Secondary | ICD-10-CM

## 2022-07-14 MED ORDER — DISULFIRAM 250 MG PO TABS: 250.0000 mg | ORAL_TABLET | Freq: Every day | ORAL | 0 refills | Status: AC

## 2022-07-14 MED ORDER — TRAZODONE HCL 50 MG PO TABS: 50.0000 mg | ORAL_TABLET | Freq: Every evening | ORAL | 0 refills | Status: AC | PRN

## 2022-07-14 MED ORDER — NALTREXONE HCL 50 MG PO TABS: 50.0000 mg | ORAL_TABLET | Freq: Every day | ORAL | 0 refills | Status: AC

## 2022-07-14 MED ORDER — NICOTINE POLACRILEX 2 MG MT GUM: 2.0000 mg | CHEWING_GUM | OROMUCOSAL | 0 refills | Status: AC | PRN

## 2022-07-14 MED ORDER — MIRTAZAPINE 7.5 MG PO TABS: 7.5000 mg | ORAL_TABLET | Freq: Every day | ORAL | 0 refills | Status: AC

## 2022-07-14 NOTE — Discharge Instructions (Addendum)
Patient is instructed prior to discharge to:  Take all medications as prescribed by his/her mental healthcare provider. Report any adverse effects and or reactions from the medicines to his/her outpatient provider promptly. Keep all scheduled appointments, to ensure that you are getting refills on time and to avoid any interruption in your medication.  If you are unable to keep an appointment call to reschedule.  Be sure to follow-up with resources and follow-up appointments provided.  Patient has been instructed & cautioned: To not engage in alcohol and or illegal drug use while on prescription medicines. In the event of worsening symptoms, patient is instructed to call the crisis hotline, 911 and or go to the nearest ED for appropriate evaluation and treatment of symptoms. To follow-up with his/her primary care provider for your other medical issues, concerns and or health care needs.  Information: -National Suicide Prevention Lifeline 1-800-SUICIDE or 4045184542.  -988 offers 24/7 access to trained crisis counselors who can help people experiencing mental health-related distress. People can call or text 988 or chat 988lifeline.org for themselves or if they are worried about a loved one who may need crisis support.    [facility] [address] [place], [place]  [address] [phone] Population served: Male veterans 18+ with substance abuse issues Eligibility: By referral only  [place] [address], [place], [place] [address] [phone] Population served: Adult men & women (56 years old and older, able to perform activities for daily living) Documents required: Valid ID & [place] [address] [place], [place]  [address] [phone] Population served: Families with children  [place] [address] [place], [place]   [address] [phone] Population served: Single women 18+ without dependents Documents required:  Kramer Card  [facility] [place] [address], [place], [place]  [address] [phone] Population served: Male veterans 18+ with substance abuse/mental health issues Eligibility: By referral only  [facility] [address] [place], [place] [address] [phone] Population served: Males 18+ Documents required: Valid ID & Social Security Card  Room at [employer] of the [place]. [address] [place] [address] [phone] or [phone] Population served: Pregnant women with or without children  Documents required: Valid ID & Social Security Banker of [employer] [place], [place], [place] [address] [phone] Population Served: Families with children, adult women, and adult men.  [facility] [address] [place], [place] [address] [phone] Population served: Men 18+, preference for disabled and/or veterans Eligibility: By referral only  [facility]) - [facility] [place], [place], [place] [address] [phone] or [phone] Population served: Families with children.

## 2022-07-14 NOTE — ED Triage Notes (Signed)
Pt presents to [facility] voluntarily. Pt states he was discharged today from [facility] after requesting to leave. Pt states he consumed alcohol today about 16oz of wine. Pt states the mother of his child encouraged him to return otherwise he would be unable to see his children. Pt denies SI/HI and AVH.

## 2022-07-14 NOTE — ED Notes (Signed)
Patient in room. Environment is secured. Will continue to monitor for safety.

## 2022-07-14 NOTE — Discharge Instructions (Signed)
List of Residential placements:   [facility]: [phone]  Benita Stabile, [place]: Male and male facility; 30-day program: (uninsured and Medicaid such as Deloria Lair, [place], [place], partners)  [place]: [phone]; men and women's facility; 28 days; Can have Medicaid tailored plan Publishing rights manager or Partners)  [place]: [phone] Janace Hoard or Jeani Hawking; 28 day program; must be fully detox; tailored Medicaid or no insurance  [facility] in [place], [place]; [phone]; 28 day all males program; no insurance accepted  [facility] Referral in [place]: Wille Glaser [phone] (no insurance or Medicaid only); 90 days; outpatient services but provide housing in apartments [place] Admission: [phone]: Patient must complete phone screening for placement: [place], [place]; 6 month program; uninsured, Medicaid, and [employer].   [facility]: no insurance required; [place]: [phone]; Intake: Herbie [place]; Must fill out application online; Christy Sartorius Delay [phone] x [place] in [place], [place]: [phone]; Admissions Coordinators Mr. Simona Huh or Jene Every; 90 day program.  [facility]: [place], [place] [phone]; Co-Ed 9 month to a year program; Online application; Men entry fee is $500 (6-21month);  [employer] [address] [place] [phone] no fee or insurance required; minimum of 2 years; Highly structured; work based; Intake Coordinator is CGerald Stabs3912 [phone] [facility] in [place] [phone] Fax number is [phone] website: www.[facility].org; Requires 3-6 page autobiography; 2 year program (18 months and then 6341monthransitional housing); Admission fee is $300; no insurance needed; work prFacilities managern SnCalvertNCAlaskaFrSuperiortaff: ReMichel [phone] have a  Men's Regenerations Program 6-41m31monthFree program; There is an initial $300 fee however, they are willing to work with patients regarding that. Application is online.  [facility] at [facility] [address] 1106; Any 7-90 day program is out of pocket; 12 month program is free of charge; there is a $275 entry fee; Patient is responsible for own transportation      [facility]1BrownsvilleC,[phone] phone  New Patient Assessment/Therapy Walk-Ins:  Monday and Wednesday: 8 am until slots are full. Every 1st and 2nd Fridays of the month: 1 pm - 5 pm.  NO ASSESSMENT/THERAPY WALK-INS ON TUEEmmaew Patient Assessment/Medication Management Walk-Ins:  Monday - Friday:  8 am - 11 am.  For all walk-ins, we ask that you arrive by 7:30 am because patients will be seen in the order of arrival.  Availability is limited; therefore, you may not be seen on the same day that you walk-in.  Our goal is to serve and meet the needs of our community to the best of our ability.  SUBSTANCE USE TREATMENT for Medicaid and State Funded/IPRS  [facility] ([facility]) [phone] phone NOTE: [facility] is no longer offering IOP services.  Serves those who are low-income or have no insurance.  [facility] [phone]igUnion CityC,[phone] [phone] NOTE: Does have Substance Abuse-Intensive Outpatient Program ([facility] Pss well as transitional housing if eligible.  RHALatimer1Prince EdwardC,[phone] phone [phone]  Day[address] [phone] HigCarolina ShoresC,[phone] phone [phone]  HALFWAY HOUSES:  Friends of [phone] Corporationfordvacancies.com  12 STEP PROGRAMS:  Alcoholics Anonymous of [place]  httReportZoo.com.cyarcotics Anonymous  of [place] GreenScrapbooking.dk  Al-Anon of [employer], [place] www.greensboroalanon.org/find-meetings.html  Nar-Anon https://nar-anon.org/find-a-meetin

## 2022-07-14 NOTE — ED Provider Notes (Cosign Needed Addendum)
Behavioral Health Urgent Care Medical Screening Exam  Patient Name: Douglas Mata MRN: [id number] Date of Evaluation: 07/14/22 Chief Complaint:   Diagnosis:  Final diagnoses:  Alcohol use disorder, severe, in early remission [facility])    History of Present illness: Douglas Mata is a 37 y.o. male. Patient presents voluntarily to [facility] behavioral health for walk-in assessment. Patient is assessed, face-to-face, by nurse practitioner. He is seated in assessment area, no acute distress. Consulted with provider, Dr.  Dwyane Dee, and chart reviewed on 07/14/2022. He  is alert and oriented, pleasant and cooperative during assessment.   Patient discharged from [facility] behavioral health facility based crisis unit earlier this date.  Patient states "I just wanted to see my kids and be with my kids for the weekend."  Patient reports he relapsed on alcohol consuming between 16 and 32 ounces of wine after discharge.  Patient reports he relapsed because "my children's mother was just bashing me so much."  Patient reports his children's mother insisted that he return to [facility] behavioral health.  [name] picked up his medications and planned to spend the weekend at the home of his children's mother visiting his children however she will not permit him to return.  He plans to present to the homeless shelter and spend the weekend.  He remains committed to his sobriety.  He will report to [facility] by 7 AM Monday, his appointment time to be assessed for residential substance use treatment.  Patient  presents with euthymic mood, congruent affect. He  denies suicidal and homicidal ideations.  Patient has normal speech and behavior.  he  denies auditory and visual hallucinations.  Patient is able to converse coherently with goal-directed thoughts and no distractibility or preoccupation.  Denies symptoms of paranoia.  Objectively there is no evidence of psychosis/mania or delusional  thinking.  Stanford's diagnoses include cocaine abuse, alcohol use disorder, alcohol induced mood disorder, behavior concern in adult.  He is not linked with outpatient psychiatry currently, plan follow-up with [facility] behavioral health.  Denies history of previous inpatient psychiatric hospitalizations.  No family mental health history reported.  Patient most recently resided with his children's mother in [place].  He denies access to weapons.  He is not currently employed.  Patient endorses average sleep and appetite.  Patient offered support and encouragement.  He gives verbal consent to speak with his children's mother, [name] phone number [phone].  Spoke with patient's children's mother who denies safety concerns however patient is not welcome to return to her home prior to residential substance use treatment.   Patient and family are educated and verbalize understanding of mental health resources and other crisis services in the community. They are instructed to call 911 and present to the nearest emergency room should patient experience any suicidal/homicidal ideation, auditory/visual/hallucinations, or detrimental worsening of mental health condition.      [place] ED from 07/14/2022 in [facility] ED from [facility]/12/2022 in [facility] from 08/08/2021 in [facility] Emergency Department at [place] No Risk No Risk No Risk       Psychiatric Specialty Exam  Presentation  General Appearance:Appropriate for Environment; Casual  Eye Contact:Good  Speech:Clear and Coherent; Normal Rate  Speech Volume:Normal  Handedness:Right   Mood and Affect  Mood: Euthymic  Affect: Appropriate; Congruent   Thought Process  Thought Processes: Coherent; Goal Directed; Linear  Descriptions of Associations:Intact  Orientation:Full (Time, Place  and Person)  Thought  Content:Logical; WDL  Diagnosis of Schizophrenia or Schizoaffective disorder in past: No   Hallucinations:None  Ideas of Reference:None  Suicidal Thoughts:No  Homicidal Thoughts:No   Sensorium  Memory: Immediate Good; Recent Good  Judgment: Intact  Insight: Fair   Community education officer  Concentration: Good  Attention Span: Good  Recall: Good  Fund of Knowledge: Fair  Language: Fair   Psychomotor Activity  Psychomotor Activity: Normal   Assets  Assets: Communication Skills; Desire for Improvement; Physical Health; Resilience; Social Support   Sleep  Sleep: Fair  Number of hours: No data recorded  Physical Exam: Physical Exam Vitals and nursing note reviewed.  Constitutional:      Appearance: Normal appearance. He is well-developed and normal weight.  HENT:     Head: Normocephalic and atraumatic.     Nose: Nose normal.  Cardiovascular:     Rate and Rhythm: Normal rate.  Pulmonary:     Effort: Pulmonary effort is normal.  Musculoskeletal:        General: Normal range of motion.     Cervical back: Normal range of motion.  Skin:    General: Skin is warm and dry.  Neurological:     Mental Status: He is alert and oriented to person, place, and time.  Psychiatric:        Attention and Perception: Attention and perception normal.        Mood and Affect: Mood and affect normal.        Speech: Speech normal.        Behavior: Behavior normal. Behavior is cooperative.        Cognition and Memory: Cognition and memory normal.    Review of Systems  Constitutional: Negative.   HENT: Negative.    Eyes: Negative.   Respiratory: Negative.    Cardiovascular: Negative.   Gastrointestinal: Negative.   Genitourinary: Negative.   Musculoskeletal: Negative.   Skin: Negative.   Neurological: Negative.   Psychiatric/Behavioral:  Positive for substance abuse.    There were no vitals taken for this visit. There is no height or weight on file to  calculate BMI.  Musculoskeletal: Strength & Muscle Tone: within normal limits Gait & Station: normal Patient leans: N/A   [place] M[facility] Discharge Disposition for Follow up and Recommendations: Based on my evaluation the patient does not appear to have an emergency medical condition and can be discharged with resources and follow up care in outpatient services for Medication Management and Individual Therapy Follow-up with substance use treatment resources provided.   Follow-up with outpatient psychiatry, resources provided. Continue current medications including: -Disulfiram 250 mg daily -Mirtazapine 7.5 mg nightly -Naltrexone 50 mg daily -Trazodone 50 mg nightly as needed/sleep  Lucky Rathke, FNP 07/14/2022, 6:26 PM

## 2022-07-14 NOTE — ED Notes (Signed)
SPIRITUALITY GROUP NOTE  Spirituality group facilitated by Simone Curia, MDiv, [place].  Group Description:  Group focused on topic of hope.  Patients participated in facilitated discussion around topic, connecting with one another around experiences and definitions for hope.  Group members engaged with visual explorer photos, reflecting on what hope looks like for them today.  Group engaged in discussion around how their definitions of hope are present today in hospital.   Modalities: Psycho-social ed, Adlerian, Narrative, MI Patient Progress: Present throughout group. Theoren engaged with other group members around hope as continual process and brainstormed practices that connect him to hope.  He noted he loves water and spoke of swimming.

## 2022-07-14 NOTE — ED Notes (Signed)
Pt is in the bed sleeping. Respirations are even and unlabored. No acute distress noted. Will continue to monitor for safety. 

## 2022-07-14 NOTE — ED Notes (Signed)
Patient A&O x 4, ambulatory. Patient discharged in no acute distress. Patient denied SI/HI, A/VH upon discharge. Patient verbalized understanding of all discharge instructions explained by staff, to include follow up appointments, RX's and safety plan. Patient reported mood 10/10.  Pt belongings returned to patient from locker #21 intact. Patient escorted to lobby via staff for transport to destination via [place] Safety maintained.

## 2022-07-14 NOTE — Discharge Planning (Signed)
LCSW received update that patient would like to discharge on today. LCSW went and spoke with patient. Patient reports he feels stable on his medications and feels like he is able to manage himself while waiting to go to [facility] on Monday. Patient made aware that he would need to be at [facility] on Monday by 7:00am. Patient expressed understanding and appreciation for LCSW assistance. No other needs were reported at this time. LCSW to sign off.   Please inform if further LCSW needs arise prior to discharge.   Lucius Conn, LCSW Clinical Social Worker [place] [facility] Ph: [phone]

## 2022-07-14 NOTE — ED Provider Notes (Signed)
FBC/OBS ASAP Discharge Summary  Date and Time: 07/14/2022 12:11 PM  Name: Douglas Mata  MRN:  [id number]   Discharge Diagnoses:  Final diagnoses:  Cocaine abuse [facility])  Behavior concern in adult  Alcohol use disorder, severe, in early remission ([name])  Alcohol-induced mood disorder ([name])  Malingering    Subjective: Patient seen and assessed at bedside. He is wanting to discharge as he is receiving a check today. He does not feel he has an alcohol problem and feels he would be able to safely discharge without concern. He requested to have his medications that were started in [facility] to be continued. I discussed risks regarding relapse and benefits of residential but patient was adamant he wanted to discharge. He was agreeable to start disulfiram as he states he wants to be evaluated  Stay Summary:  Douglas Mata is a 37 yo male w/ no pertinent past psychiatric history presenting to [facility] seeking alcohol detox and residential substance use treatment.   He was started on remeron 7.5 mg qhs for depression and naltrexone for alcohol use disorder. CIWA never exceeded 1 throughout hospitalization. He tolerated stay well and had no acute concerns while in hospital. He denied SI/HI/AVH throughout hospitalization. He requested discharge stating he had a check waiting for him and he was here primarily due to housing instability. Concern for malingering. He was agreeable to being discharged with a prescription of disulfiram as a deterrent for alcohol.    Total Time spent with patient: 45 minutes  Past Psychiatric History: reported PTSD Past Medical History: none Family History: unknown Family Psychiatric History: unknown Social Hx: 0.5 ppd tobacco, alcohol abuse Social History   Socioeconomic History   Marital status: Single    Spouse name: Not on file   Number of children: Not on file   Years of education: Not on file   Highest education level: Not on file  Occupational History   Not  on file  Tobacco Use   Smoking status: Every Day    Packs/day: 0.50    Types: Cigarettes   Smokeless tobacco: Never  Vaping Use   Vaping Use: Some days  Substance and Sexual Activity   Alcohol use: Yes   Drug use: Never   Sexual activity: Not on file  Other Topics Concern   Not on file  Social History Narrative   ** Merged History Encounter **       Social Determinants of Health   Financial Resource Strain: Not on file  Food Insecurity: Not on file  Transportation Needs: Not on file  Physical Activity: Not on file  Stress: Not on file  Social Connections: Not on file  Intimate Partner Violence: Not on file    Tobacco Cessation:  A prescription for an FDA-approved tobacco cessation medication provided at discharge  Current Medications:  Current Facility-Administered Medications  Medication Dose Route Frequency Provider Last Rate Last Admin   acetaminophen (TYLENOL) tablet 650 mg  650 mg Oral Q6H PRN [name], NP       alum & mag hydroxide-simeth (MAALOX/MYLANTA) 200-200-20 MG/5ML suspension 30 mL  30 mL Oral Q4H PRN [name], NP       hydrOXYzine (ATARAX) tablet 25 mg  25 mg Oral TID PRN France Ravens, MD   25 mg at 07/13/22 2122   loperamide (IMODIUM) capsule 2-4 mg  2-4 mg Oral PRN [name], NP       LORazepam (ATIVAN) tablet 1 mg  1 mg Oral Q6H PRN [name], NP  magnesium hydroxide (MILK OF MAGNESIA) suspension 30 mL  30 mL Oral Daily PRN [name], NP       mirtazapine (REMERON) tablet 7.5 mg  7.5 mg Oral QHS France Ravens, MD   7.5 mg at 07/13/22 2121   multivitamin with minerals tablet 1 tablet  1 tablet Oral Daily [name], NP   1 tablet at 07/14/22 0959   naltrexone (DEPADE) tablet 50 mg  50 mg Oral Daily France Ravens, MD   50 mg at 07/14/22 [id number]   nicotine polacrilex (NICORETTE) gum 2 mg  2 mg Oral PRN Rolanda Lundborg, MD   2 mg at 07/13/22 1243   ondansetron (ZOFRAN-ODT) disintegrating tablet 4 mg  4 mg Oral Q6H PRN [name], NP        thiamine (VITAMIN B1) tablet 100 mg  100 mg Oral Daily [name], NP   100 mg at 07/14/22 0959   traZODone (DESYREL) tablet 50 mg  50 mg Oral QHS PRN France Ravens, MD   50 mg at 07/13/22 2122   Current Outpatient Medications  Medication Sig Dispense Refill   disulfiram (ANTABUSE) 250 MG tablet Take 1 tablet (250 mg total) by mouth daily. 30 tablet 0   mirtazapine (REMERON) 7.5 MG tablet Take 1 tablet (7.5 mg total) by mouth at bedtime. 30 tablet 0   [START ON 07/15/2022] naltrexone (DEPADE) 50 MG tablet Take 1 tablet (50 mg total) by mouth daily. 30 tablet 0   nicotine polacrilex (NICORETTE) 2 MG gum Take 1 each (2 mg total) by mouth as needed for smoking cessation. 100 tablet 0   traZODone (DESYREL) 50 MG tablet Take 1 tablet (50 mg total) by mouth at bedtime as needed for sleep. 30 tablet 0    PTA Medications:  PTA Medications  Medication Sig   disulfiram (ANTABUSE) 250 MG tablet Take 1 tablet (250 mg total) by mouth daily.   mirtazapine (REMERON) 7.5 MG tablet Take 1 tablet (7.5 mg total) by mouth at bedtime.   nicotine polacrilex (NICORETTE) 2 MG gum Take 1 each (2 mg total) by mouth as needed for smoking cessation.   traZODone (DESYREL) 50 MG tablet Take 1 tablet (50 mg total) by mouth at bedtime as needed for sleep.   [START ON 07/15/2022] naltrexone (DEPADE) 50 MG tablet Take 1 tablet (50 mg total) by mouth daily.   Facility Ordered Medications  Medication   acetaminophen (TYLENOL) tablet 650 mg   alum & mag hydroxide-simeth (MAALOX/MYLANTA) 200-200-20 MG/5ML suspension 30 mL   magnesium hydroxide (MILK OF MAGNESIA) suspension 30 mL   [COMPLETED] thiamine (VITAMIN B1) injection 100 mg   thiamine (VITAMIN B1) tablet 100 mg   multivitamin with minerals tablet 1 tablet   LORazepam (ATIVAN) tablet 1 mg   loperamide (IMODIUM) capsule 2-4 mg   ondansetron (ZOFRAN-ODT) disintegrating tablet 4 mg   mirtazapine (REMERON) tablet 7.5 mg   hydrOXYzine (ATARAX) tablet 25 mg   traZODone  (DESYREL) tablet 50 mg   naltrexone (DEPADE) tablet 50 mg   nicotine polacrilex (NICORETTE) gum 2 mg       07/14/2022   11:59 AM 07/12/2022    2:14 PM  Depression screen PHQ 2/9  Decreased Interest 0 3  Down, Depressed, Hopeless 0 3  PHQ - 2 Score 0 6    Flowsheet Row ED from 07/12/2022 in [facility] ED from 08/08/2021 in [facility] Emergency Department at [place] No Risk No Risk  Musculoskeletal  Strength & Muscle Tone: within normal limits Gait & Station: normal Patient leans: N/A  Psychiatric Specialty Exam  Presentation  General Appearance:  Casual  Eye Contact: Good  Speech: Clear and Coherent  Speech Volume: Decreased  Handedness: Right   Mood and Affect  Mood: Anxious  Affect: Appropriate   Thought Process  Thought Processes: Coherent  Descriptions of Associations:Circumstantial  Orientation:Full (Time, Place and Person)  Thought Content:Logical  Diagnosis of Schizophrenia or Schizoaffective disorder in past: No    Hallucinations:No data recorded Ideas of Reference:None  Suicidal Thoughts:No data recorded Homicidal Thoughts:No data recorded  Sensorium  Memory: Immediate Fair  Judgment: Poor  Insight: Fair   Materials engineer: Fair  Attention Span: Fair  Recall: [employer] of Knowledge: Fair  Language: Fair   Psychomotor Activity  Psychomotor Activity:No data recorded  Assets  Assets: Desire for Improvement; Resilience; Talents/Skills   Sleep  Sleep:No data recorded  No data recorded  Physical Exam  Physical Exam Vitals and nursing note reviewed.  Constitutional:      General: He is not in acute distress.    Appearance: He is well-developed.  HENT:     Head: Normocephalic and atraumatic.  Eyes:     Conjunctiva/sclera: Conjunctivae normal.  Cardiovascular:     Rate and Rhythm: Normal rate and regular rhythm.      Heart sounds: No murmur heard. Pulmonary:     Effort: Pulmonary effort is normal. No respiratory distress.     Breath sounds: Normal breath sounds.  Abdominal:     Palpations: Abdomen is soft.     Tenderness: There is no abdominal tenderness.  Musculoskeletal:        General: No swelling.     Cervical back: Neck supple.  Skin:    General: Skin is warm and dry.     Capillary Refill: Capillary refill takes less than 2 seconds.  Neurological:     Mental Status: He is alert.  Psychiatric:        Mood and Affect: Mood normal.    Review of Systems  Respiratory:  Negative for shortness of breath.   Cardiovascular:  Negative for chest pain.  Gastrointestinal:  Negative for abdominal pain, constipation, diarrhea, heartburn, nausea and vomiting.  Neurological:  Negative for headaches.   Blood pressure 125/84, pulse 74, temperature 98.3 F (36.8 C), temperature source Oral, resp. rate 16, SpO2 100 %. There is no height or weight on file to calculate BMI.  Demographic Factors:  Male, Low socioeconomic status, and Unemployed  Loss Factors: NA  Historical Factors: Impulsivity  Risk Reduction Factors:   Positive social support and Positive coping skills or problem solving skills  Continued Clinical Symptoms:  Alcohol/Substance Abuse/Dependencies  Cognitive Features That Contribute To Risk:  Closed-mindedness    Suicide Risk:  Mild:  There are no identifiable plans, no associated intent, mild dysphoria and related symptoms, good self-control (both objective and subjective assessment), few other risk factors, and identifiable protective factors, including available and accessible social support.  Plan Of Care/Follow-up recommendations:   Follow-up recommendations:   Activity:  as tolerated Diet:  heart healthy   Comments:  Prescriptions were given at discharge.  Patient is agreeable with the discharge plan.  Patient was given an opportunity to ask questions.  Patient  appears to feel comfortable with discharge and denies any current suicidal or homicidal thoughts.    Patient is instructed prior to discharge to: Take all medications as prescribed by mental healthcare provider. Report  any adverse effects and or reactions from the medicines to outpatient provider promptly. In the event of worsening symptoms, patient is instructed to call the crisis hotline, 911 and or go to the nearest ED for appropriate evaluation and treatment of symptoms. Patient is to follow-up with primary care provider for other medical issues, concerns and or health care needs.   France Ravens, MD 07/14/2022, 12:11 PM

## 2022-07-14 NOTE — ED Notes (Signed)
Patient alert and oriented x 3. Denies SI/HI/AVH. Denies intent or plan to harm self or others. Routine conducted according to faculty protocol. Encourage patient to notify staff with any needs or concerns. Patient verbalized agreement and understanding. Will continue to monitor for safety. 

## 2022-07-14 NOTE — ED Notes (Signed)
Patient  sleeping in no acute stress. RR even and unlabored .Environment secured .Will continue to monitor for safely. 

## 2022-11-05 IMAGING — CT CT MAXILLOFACIAL W/O CM
3 series · 15 of 47 positions shown, 18 images · non-contrast
Comparison: None.

CLINICAL DATA: Facial trauma, blunt

EXAM:
CT HEAD WITHOUT CONTRAST
CT MAXILLOFACIAL WITHOUT CONTRAST
TECHNIQUE: Multidetector CT imaging of the head and maxillofacial structures
were performed using the standard protocol without intravenous
contrast. Multiplanar CT image reconstructions of the maxillofacial
structures were also generated.
RADIATION DOSE REDUCTION: This exam was performed according to the
departmental dose-optimization program which includes automated
exposure control, adjustment of the mA and/or kV according to
patient size and/or use of iterative reconstruction technique.

[Series 1: max soft · axial · 0.36mm/px · z∈[-218,-68]mm · 9 of 87 slices shown, 12 images]
[im 6/87  brain]
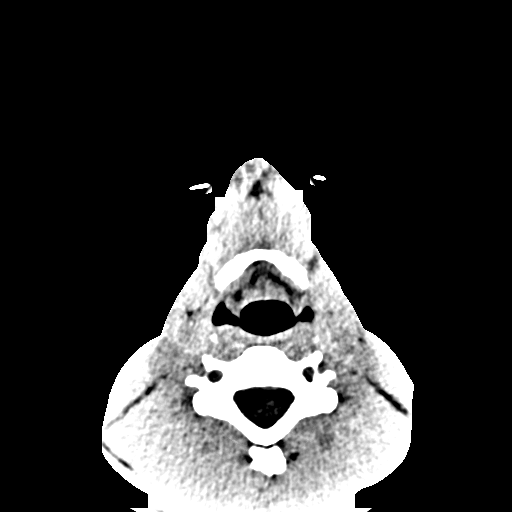
[im 6/87  bone]
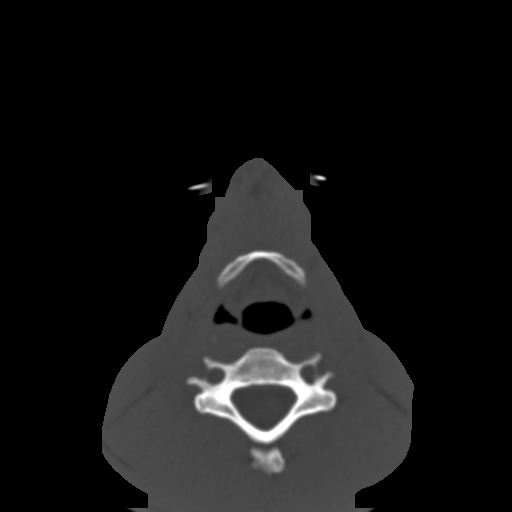
[im 15/87  bone]
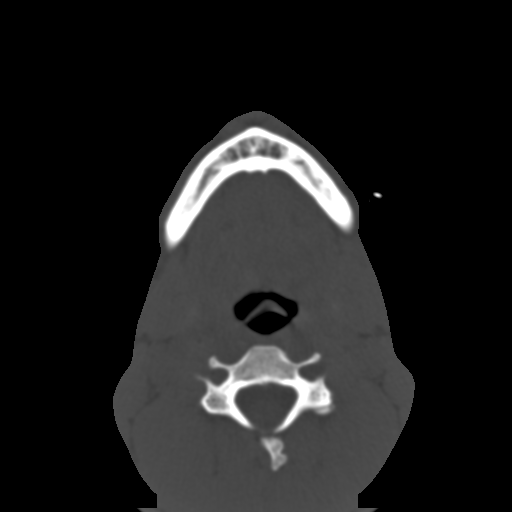
[im 24/87  bone]
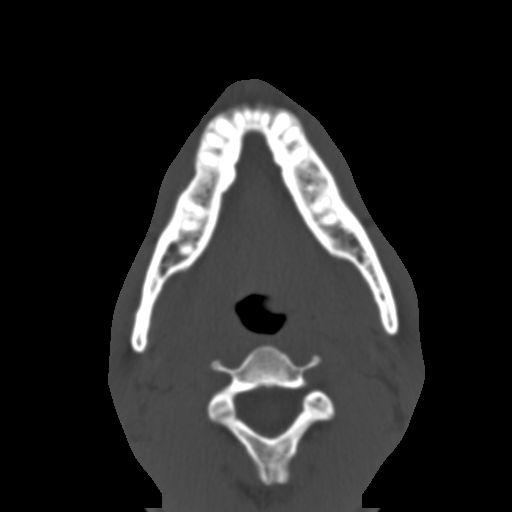
[im 33/87  bone]
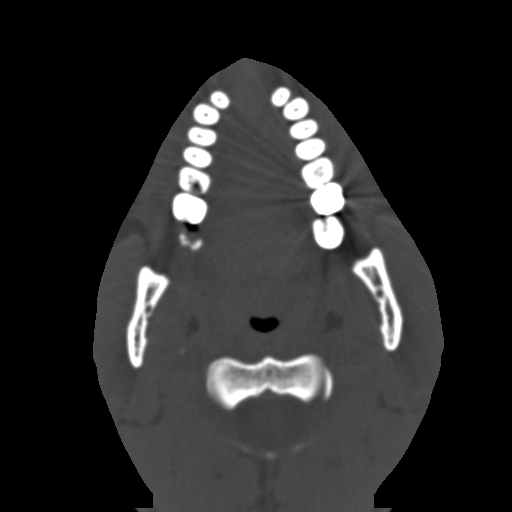
[im 45/87  brain]
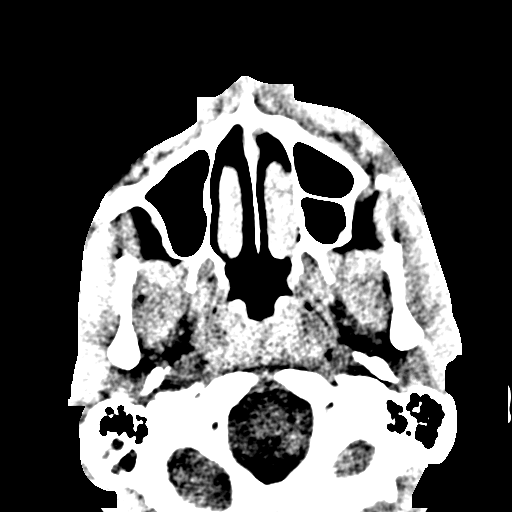
[im 45/87  bone]
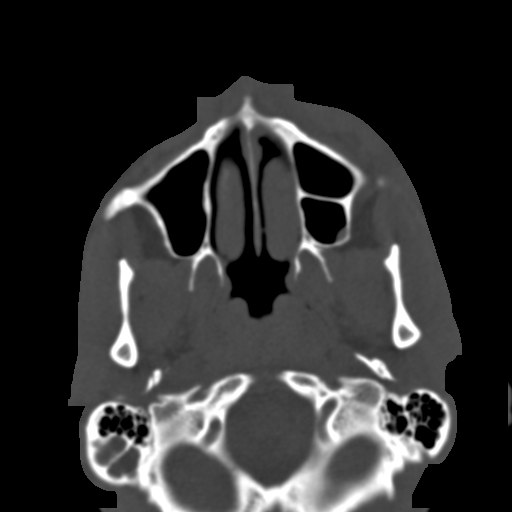
[im 54/87  bone]
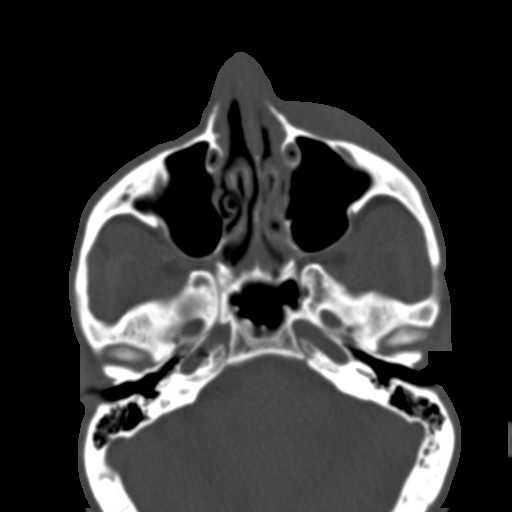
[im 63/87  bone]
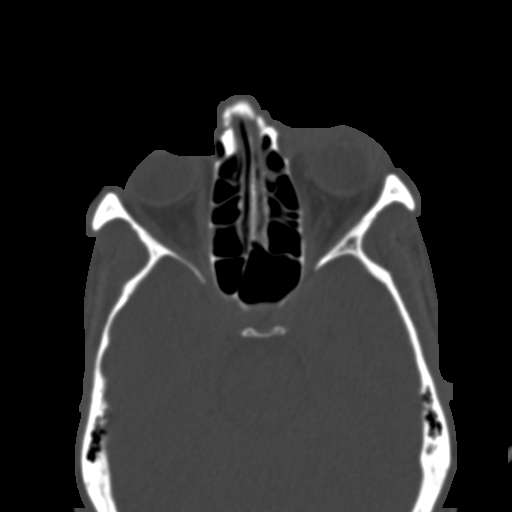
[im 72/87  bone]
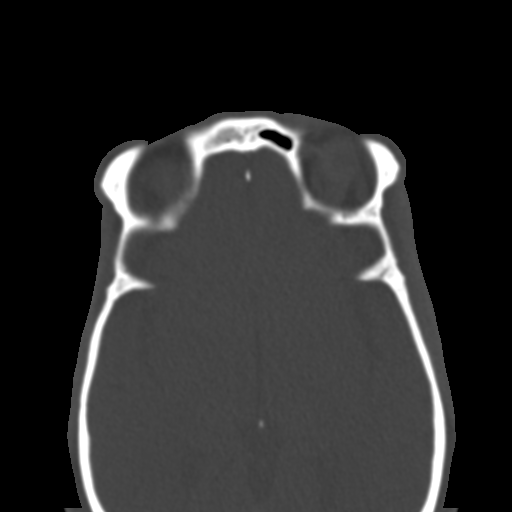
[im 81/87  brain]
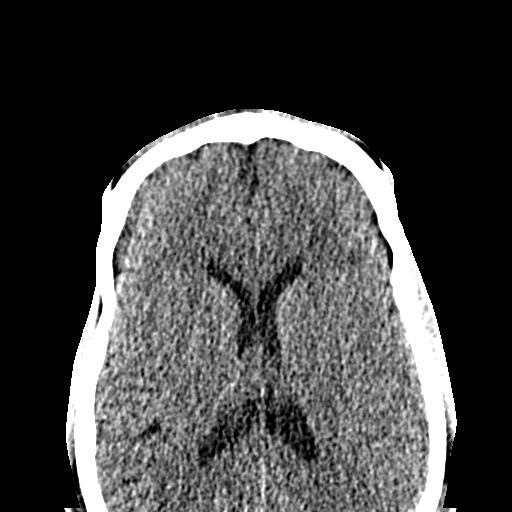
[im 81/87  bone]
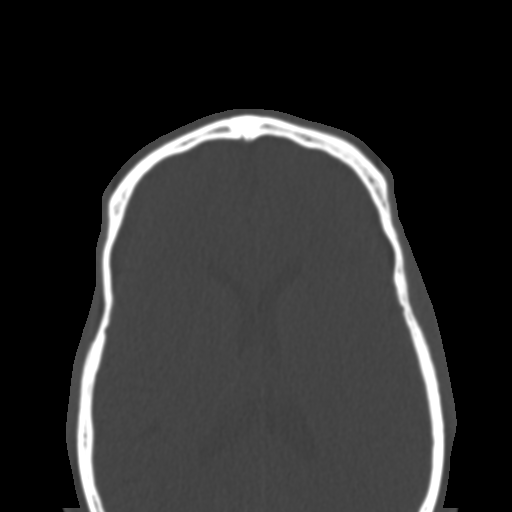

[Series 7: coronal soft · coronal · 0.34mm/px · 3 of 75 slices shown]
[im 25/75  bone]
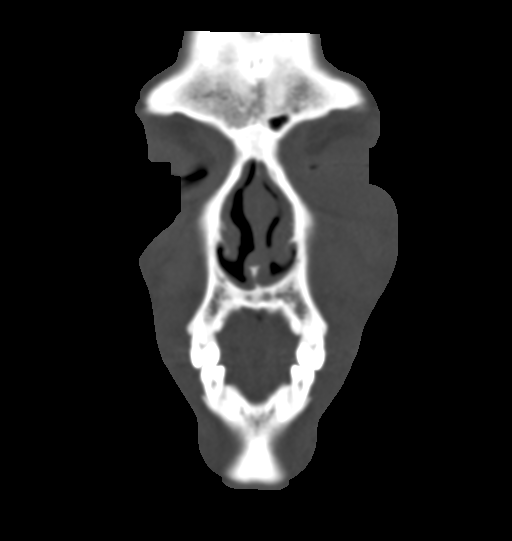
[im 33/75  bone]
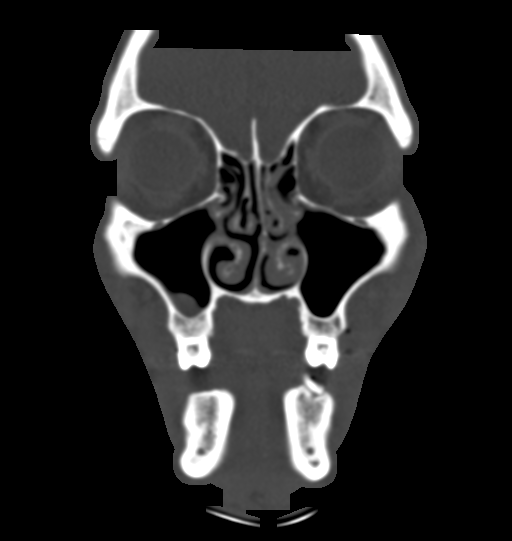
[im 42/75  bone]
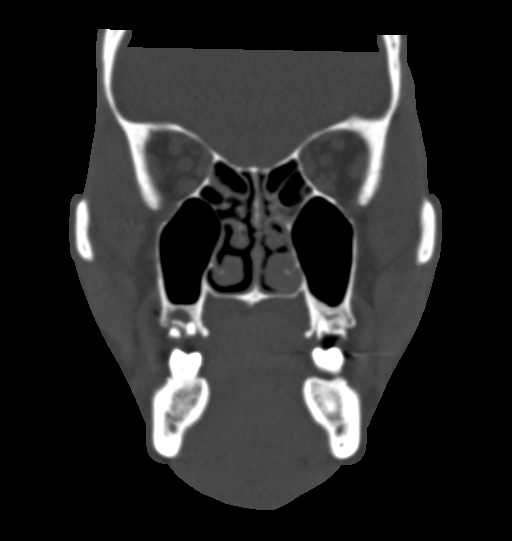

[Series 9: sagittal soft · sagittal · 0.29mm/px · 3 of 88 slices shown]
[im 30/88  bone]
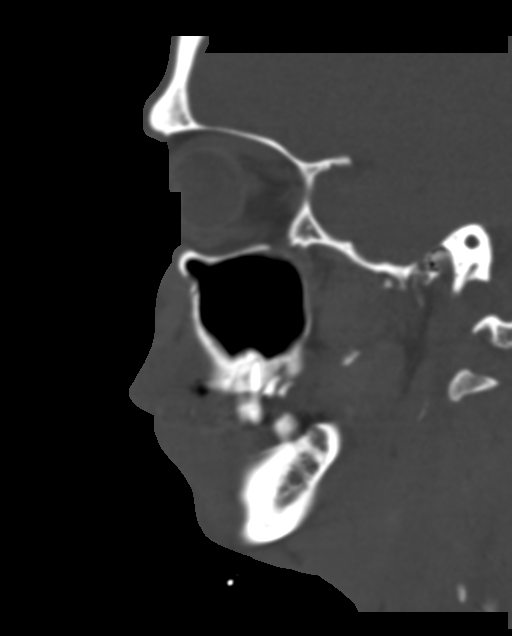
[im 44/88  bone]
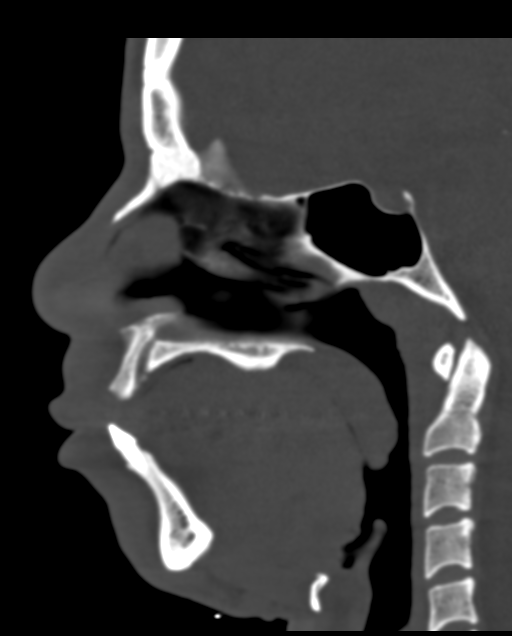
[im 59/88  bone]
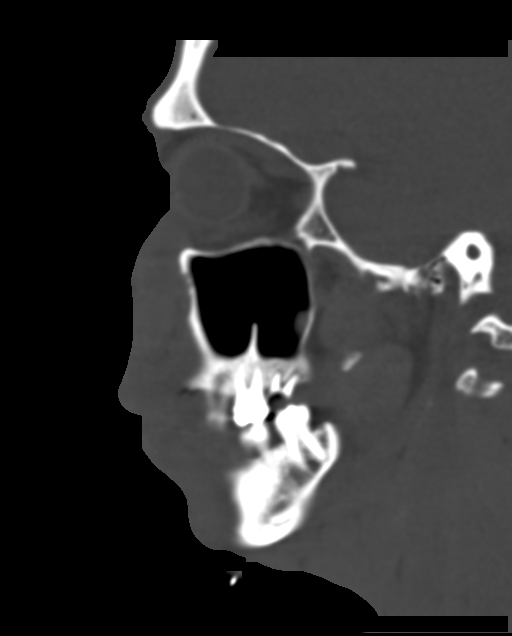

[15 of 47 positions shown; findings below may reference images not displayed]

FINDINGS: CT HEAD FINDINGS

Brain:

No evidence of large-territorial acute infarction. No parenchymal
hemorrhage. No mass lesion. No extra-axial collection.

No mass effect or midline shift. No hydrocephalus. Basilar cisterns
are patent.

Vascular: No hyperdense vessel.

Skull: No acute fracture or focal lesion.

Other: None.

CT MAXILLOFACIAL FINDINGS

Osseous: No acute displaced facial fracture. No suspicious lytic or
blastic osseous lesion. Chronic comminuted right nasal bone
fracture. Chronic minimally displaced left nasal bone fracture.

Sinuses/Orbits: Partial right mastoid air cell effusion. Otherwise
paranasal sinuses and left mastoid air cells are clear. The orbits
are unremarkable.

Soft tissues: Left maxillary and periorbital subcutaneus soft tissue
edema and hematoma formation.
IMPRESSION: 1. No acute intracranial abnormality.
2. No acute displaced facial fracture in a patient with old nasal
fractures.
3. Partial right mastoid air cell effusion.

## 2024-05-19 ENCOUNTER — Emergency Department
Admission: EM | Admit: 2024-05-19 | Discharge: 2024-05-19 | Disposition: A | Discharge status: Home / Self Care | Payer: MEDICAID | Attending: Emergency Medicine | Admitting: Emergency Medicine

## 2024-05-19 ENCOUNTER — Other Ambulatory Visit: Payer: Self-pay

## 2024-05-19 ENCOUNTER — Encounter: Payer: Self-pay | Admitting: Emergency Medicine

## 2024-05-19 DIAGNOSIS — M79671 Pain in right foot: Secondary | ICD-10-CM | POA: Diagnosis present

## 2024-05-19 DIAGNOSIS — M79672 Pain in left foot: Secondary | ICD-10-CM | POA: Insufficient documentation

## 2024-05-19 DIAGNOSIS — F17210 Nicotine dependence, cigarettes, uncomplicated: Secondary | ICD-10-CM | POA: Diagnosis not present

## 2024-05-19 LAB — CBC WITH DIFFERENTIAL/PLATELET
Abs Immature Granulocytes: 0.02 K/uL (ref 0.00–0.07)
Basophils Absolute: 0 K/uL (ref 0.0–0.1)
Basophils Relative: 0 %
Eosinophils Absolute: 0.1 K/uL (ref 0.0–0.5)
Eosinophils Relative: 2 %
HCT: 44.5 % (ref 39.0–52.0)
Hemoglobin: 15.3 g/dL (ref 13.0–17.0)
Immature Granulocytes: 0 %
Lymphocytes Relative: 24 %
Lymphs Abs: 1.5 K/uL (ref 0.7–4.0)
MCH: 31.2 pg (ref 26.0–34.0)
MCHC: 34.4 g/dL (ref 30.0–36.0)
MCV: 90.6 fL (ref 80.0–100.0)
Monocytes Absolute: 0.7 K/uL (ref 0.1–1.0)
Monocytes Relative: 12 %
Neutro Abs: 3.9 K/uL (ref 1.7–7.7)
Neutrophils Relative %: 62 %
Platelets: 377 K/uL (ref 150–400)
RBC: 4.91 MIL/uL (ref 4.22–5.81)
RDW: 13.2 % (ref 11.5–15.5)
WBC: 6.3 K/uL (ref 4.0–10.5)
nRBC: 0 % (ref 0.0–0.2)

## 2024-05-19 LAB — ETHANOL: Alcohol, Ethyl (B): <15 mg/dL

## 2024-05-19 LAB — COMPREHENSIVE METABOLIC PANEL WITH GFR
ALT: 18 U/L (ref 0–44)
AST: 34 U/L (ref 15–41)
Albumin: 4.2 g/dL (ref 3.5–5.0)
Alkaline Phosphatase: 85 U/L (ref 38–126)
Anion gap: 11 (ref 5–15)
BUN: 16 mg/dL (ref 6–20)
CO2: 23 mmol/L (ref 22–32)
Calcium: 9.1 mg/dL (ref 8.9–10.3)
Chloride: 101 mmol/L (ref 98–111)
Creatinine, Ser: 0.8 mg/dL (ref 0.61–1.24)
GFR, Estimated: >60 mL/min
Glucose, Bld: 95 mg/dL (ref 70–99)
Potassium: 4.6 mmol/L (ref 3.5–5.1)
Sodium: 135 mmol/L (ref 135–145)
Total Bilirubin: 0.5 mg/dL (ref 0.0–1.2)
Total Protein: 7.7 g/dL (ref 6.5–8.1)

## 2024-05-19 LAB — URINE DRUG SCREEN
Amphetamines: POSITIVE — AB
Barbiturates: NEGATIVE
Benzodiazepines: NEGATIVE
Cocaine: NEGATIVE
Fentanyl: NEGATIVE
Methadone Scn, Ur: NEGATIVE
Opiates: NEGATIVE
Tetrahydrocannabinol: POSITIVE — AB

## 2024-05-19 NOTE — ED Triage Notes (Addendum)
 Pt says he has been outside all night and can't feel his feet. Pt was hiding all night at [facility] and flagged an officer down this morning and told them he needed medical attention. Pt with normal gait in triage.    Pt says he came up from [place] last night with someone who he thought was a friend. He reports that person began acting weird and he was in fear for his life, so he ran to hide from them. Pt denies SI/HI. No AV hallucinations.

## 2024-05-19 NOTE — ED Notes (Signed)
Pt encouraged to give a urine sample.  

## 2024-05-19 NOTE — Discharge Instructions (Addendum)
 Follow-up with urgent care if any continued problems.  All lab work is normal.  Read the information about shelters for your information.

## 2024-05-19 NOTE — ED Provider Notes (Signed)
 "  [facility] Provider Note    Event Date/Time   First MD Initiated Contact with Patient 05/19/24 [phone]     (approximate)   History   Foot Pain   HPI  Douglas Mata is a 38 y.o. male   presents to the ED with complaint of his feet being cold and decreased sensation.  Patient states that he lives in [place] and got in a car with a known male to get something to eat last night.  He states that she drove to [place] and began acting weird.  He reports that he then began was afraid that she was going to hurt him so he ran and he did from her, stayed outside all night hiding.  He states that he saw a police man and told him that he needed medical attention.  He reports smoking 1 pack cigarettes per day, drinking beer on occasion and smokes marijuana with the last time being 2 days ago.  He denies use of any other recreational drugs.  Patient has history of polysubstance abuse.      Physical Exam   Triage Vital Signs: ED Triage Vitals  Encounter Vitals Group     BP 05/19/24 0827 (!) 126/91     Girls Systolic BP Percentile --      Girls Diastolic BP Percentile --      Boys Systolic BP Percentile --      Boys Diastolic BP Percentile --      Pulse Rate 05/19/24 0827 85     Resp 05/19/24 0827 15     Temp 05/19/24 0827 97.9 F (36.6 C)     Temp Source 05/19/24 0827 Oral     SpO2 05/19/24 0827 99 %     Weight 05/19/24 0828 115 lb (52.2 kg)     Height 05/19/24 0828 5' 5 (1.651 m)     Head Circumference --      Peak Flow --      Pain Score 05/19/24 0828 10     Pain Loc --      Pain Education --      Exclude from Growth Chart --     Most recent vital signs: Vitals:   05/19/24 0840 05/19/24 1209  BP:  122/88  Pulse:  80  Resp:  18  Temp:    SpO2: 100% 100%     General: Awake, no distress.  Alert, talkative, able to answer questions appropriately. CV:  Good peripheral perfusion.  Heart rate and rate rhythm. Resp:  Normal effort.  Lungs  are clear bilaterally. Abd:  No distention.  Other:  On examination of the feet bilaterally the patient has on wet socks which were removed.  Skin is intact.  No discoloration present.  Pulses are present bilaterally.  Fungal nails are present.   ED Results / Procedures / Treatments   Labs (all labs ordered are listed, but only abnormal results are displayed) Labs Reviewed  URINE DRUG SCREEN - Abnormal; Notable for the following components:      Result Value   Amphetamines POSITIVE (*)    Tetrahydrocannabinol POSITIVE (*)    All other components within normal limits  CBC WITH DIFFERENTIAL/PLATELET  ETHANOL  COMPREHENSIVE METABOLIC PANEL WITH GFR      PROCEDURES:  Critical Care performed:   Procedures   MEDICATIONS ORDERED IN ED: Medications - No data to display   IMPRESSION / MDM / ASSESSMENT AND PLAN / ED COURSE  I reviewed the triage vital  signs and the nursing notes.   Differential diagnosis includes, but is not limited to, mental disorder, malingering, homeless, polysubstance abuse.  38 year old male presents to the ED after being picked up by police due to spending the night outside and fearing for his life.  Basic lab work was done and patient was made aware that this was normal.  UDS however was positive for as noted on drug screen.  Alcohol was less than 15.  Patient was given a list of shelters for resource purposes and while in the ED was given food and fluids.  Patient had no further complaints and was discharged.      Patient's presentation is most consistent with acute illness / injury with system symptoms.  FINAL CLINICAL IMPRESSION(S) / ED DIAGNOSES   Final diagnoses:  Foot pain, bilateral     Rx / DC Orders   ED Discharge Orders     None        Note:  This document was prepared using Dragon voice recognition software and may include unintentional dictation errors.   Saunders Shona CROME, PA-C 05/19/24 1537    Arlander Charleston,  MD 05/22/24 [phone]  "

## 2024-05-19 NOTE — ED Notes (Signed)
Pt encouraged again to give a urine sample
